# Patient Record
Sex: Female | Born: 1950 | Race: Black or African American | Hispanic: No | Smoking: Never smoker
Health system: Southern US, Community
[De-identification: ages and names within clinical notes are randomized; demographics above are authoritative.]

## PROBLEM LIST (undated history)

## (undated) DIAGNOSIS — C569 Malignant neoplasm of unspecified ovary: Secondary | ICD-10-CM

## (undated) DIAGNOSIS — I10 Essential (primary) hypertension: Secondary | ICD-10-CM

## (undated) DIAGNOSIS — C50919 Malignant neoplasm of unspecified site of unspecified female breast: Secondary | ICD-10-CM

## (undated) DIAGNOSIS — E119 Type 2 diabetes mellitus without complications: Secondary | ICD-10-CM

## (undated) DIAGNOSIS — J301 Allergic rhinitis due to pollen: Secondary | ICD-10-CM

## (undated) HISTORY — DX: Malignant neoplasm of unspecified ovary: C56.9

## (undated) HISTORY — PX: TONSILLECTOMY: SUR1361

## (undated) HISTORY — DX: Type 2 diabetes mellitus without complications: E11.9

## (undated) HISTORY — PX: OTHER SURGICAL HISTORY: SHX169

## (undated) HISTORY — PX: BILATERAL TOTAL MASTECTOMY WITH AXILLARY LYMPH NODE DISSECTION: SHX6364

## (undated) HISTORY — PX: ABDOMINAL HYSTERECTOMY: SHX81

## (undated) HISTORY — DX: Allergic rhinitis due to pollen: J30.1

## (undated) HISTORY — DX: Malignant neoplasm of unspecified site of unspecified female breast: C50.919

## (undated) HISTORY — DX: Essential (primary) hypertension: I10

---

## 1998-03-31 ENCOUNTER — Emergency Department (HOSPITAL_COMMUNITY): Admission: EM | Admit: 1998-03-31 | Discharge: 1998-03-31 | Payer: Self-pay | Admitting: Internal Medicine

## 1998-05-06 ENCOUNTER — Emergency Department (HOSPITAL_COMMUNITY): Admission: EM | Admit: 1998-05-06 | Discharge: 1998-05-06 | Payer: Self-pay | Admitting: Emergency Medicine

## 1998-05-20 ENCOUNTER — Ambulatory Visit (HOSPITAL_COMMUNITY): Admission: RE | Admit: 1998-05-20 | Discharge: 1998-05-20 | Payer: Self-pay | Admitting: Cardiology

## 1998-11-05 ENCOUNTER — Emergency Department (HOSPITAL_COMMUNITY): Admission: EM | Admit: 1998-11-05 | Discharge: 1998-11-05 | Payer: Self-pay | Admitting: Emergency Medicine

## 1999-10-30 ENCOUNTER — Emergency Department (HOSPITAL_COMMUNITY): Admission: EM | Admit: 1999-10-30 | Discharge: 1999-10-30 | Payer: Self-pay | Admitting: Emergency Medicine

## 2001-11-13 ENCOUNTER — Encounter: Admission: RE | Admit: 2001-11-13 | Discharge: 2001-11-13 | Payer: Self-pay | Admitting: Unknown Physician Specialty

## 2001-11-13 ENCOUNTER — Encounter: Payer: Self-pay | Admitting: Unknown Physician Specialty

## 2004-01-27 ENCOUNTER — Ambulatory Visit (HOSPITAL_COMMUNITY): Admission: RE | Admit: 2004-01-27 | Discharge: 2004-01-27 | Payer: Self-pay | Admitting: Unknown Physician Specialty

## 2005-10-16 ENCOUNTER — Ambulatory Visit (HOSPITAL_COMMUNITY): Admission: RE | Admit: 2005-10-16 | Discharge: 2005-10-16 | Payer: Self-pay | Admitting: Unknown Physician Specialty

## 2005-11-07 ENCOUNTER — Encounter: Admission: RE | Admit: 2005-11-07 | Discharge: 2005-11-07 | Payer: Self-pay | Admitting: Unknown Physician Specialty

## 2005-11-12 ENCOUNTER — Encounter (INDEPENDENT_AMBULATORY_CARE_PROVIDER_SITE_OTHER): Payer: Self-pay | Admitting: *Deleted

## 2005-11-12 ENCOUNTER — Encounter (INDEPENDENT_AMBULATORY_CARE_PROVIDER_SITE_OTHER): Payer: Self-pay | Admitting: Diagnostic Radiology

## 2005-11-12 ENCOUNTER — Other Ambulatory Visit: Admission: RE | Admit: 2005-11-12 | Discharge: 2005-11-12 | Payer: Self-pay | Admitting: Diagnostic Radiology

## 2005-11-12 ENCOUNTER — Encounter: Admission: RE | Admit: 2005-11-12 | Discharge: 2005-11-12 | Payer: Self-pay | Admitting: Unknown Physician Specialty

## 2005-11-20 ENCOUNTER — Encounter: Admission: RE | Admit: 2005-11-20 | Discharge: 2005-11-20 | Payer: Self-pay | Admitting: Unknown Physician Specialty

## 2005-12-04 ENCOUNTER — Ambulatory Visit (HOSPITAL_BASED_OUTPATIENT_CLINIC_OR_DEPARTMENT_OTHER): Admission: RE | Admit: 2005-12-04 | Discharge: 2005-12-04 | Payer: Self-pay | Admitting: Surgery

## 2005-12-04 ENCOUNTER — Encounter (INDEPENDENT_AMBULATORY_CARE_PROVIDER_SITE_OTHER): Payer: Self-pay | Admitting: *Deleted

## 2005-12-05 ENCOUNTER — Encounter: Payer: Self-pay | Admitting: Emergency Medicine

## 2005-12-13 ENCOUNTER — Ambulatory Visit: Payer: Self-pay | Admitting: Oncology

## 2005-12-19 LAB — CANCER ANTIGEN 27.29: CA 27.29: 15 U/mL (ref 0–39)

## 2005-12-19 LAB — COMPREHENSIVE METABOLIC PANEL
ALT: 9 U/L (ref 0–40)
CO2: 27 mEq/L (ref 19–32)
Calcium: 8.9 mg/dL (ref 8.4–10.5)
Chloride: 106 mEq/L (ref 96–112)
Sodium: 141 mEq/L (ref 135–145)
Total Protein: 6.9 g/dL (ref 6.0–8.3)

## 2005-12-19 LAB — CBC WITH DIFFERENTIAL/PLATELET
Eosinophils Absolute: 0.4 10*3/uL (ref 0.0–0.5)
MONO#: 0.8 10*3/uL (ref 0.1–0.9)
NEUT#: 5.7 10*3/uL (ref 1.5–6.5)
Platelets: 371 10*3/uL (ref 145–400)
RBC: 4.83 10*6/uL (ref 3.70–5.32)
RDW: 15.5 % — ABNORMAL HIGH (ref 11.3–14.5)
WBC: 9.6 10*3/uL (ref 3.9–10.0)
lymph#: 2.6 10*3/uL (ref 0.9–3.3)

## 2005-12-19 LAB — LACTATE DEHYDROGENASE: LDH: 138 U/L (ref 94–250)

## 2005-12-21 ENCOUNTER — Ambulatory Visit (HOSPITAL_COMMUNITY): Admission: RE | Admit: 2005-12-21 | Discharge: 2005-12-21 | Payer: Self-pay | Admitting: Oncology

## 2005-12-26 ENCOUNTER — Ambulatory Visit (HOSPITAL_COMMUNITY): Admission: RE | Admit: 2005-12-26 | Discharge: 2005-12-26 | Payer: Self-pay | Admitting: Oncology

## 2005-12-28 ENCOUNTER — Ambulatory Visit (HOSPITAL_COMMUNITY): Admission: RE | Admit: 2005-12-28 | Discharge: 2005-12-28 | Payer: Self-pay | Admitting: Oncology

## 2005-12-28 LAB — CBC WITH DIFFERENTIAL/PLATELET
Basophils Absolute: 0 10*3/uL (ref 0.0–0.1)
EOS%: 3.6 % (ref 0.0–7.0)
Eosinophils Absolute: 0.4 10*3/uL (ref 0.0–0.5)
HCT: 36.9 % (ref 34.8–46.6)
HGB: 12 g/dL (ref 11.6–15.9)
MONO#: 0.8 10*3/uL (ref 0.1–0.9)
NEUT#: 6 10*3/uL (ref 1.5–6.5)
NEUT%: 60.1 % (ref 39.6–76.8)
RDW: 16.2 % — ABNORMAL HIGH (ref 11.3–14.5)
WBC: 9.9 10*3/uL (ref 3.9–10.0)
lymph#: 2.8 10*3/uL (ref 0.9–3.3)

## 2005-12-28 LAB — COMPREHENSIVE METABOLIC PANEL
Albumin: 4 g/dL (ref 3.5–5.2)
Alkaline Phosphatase: 74 U/L (ref 39–117)
Calcium: 9.4 mg/dL (ref 8.4–10.5)
Chloride: 103 mEq/L (ref 96–112)
Glucose, Bld: 103 mg/dL — ABNORMAL HIGH (ref 70–99)
Potassium: 3.8 mEq/L (ref 3.5–5.3)
Sodium: 141 mEq/L (ref 135–145)
Total Protein: 7.3 g/dL (ref 6.0–8.3)

## 2006-01-07 ENCOUNTER — Ambulatory Visit (HOSPITAL_BASED_OUTPATIENT_CLINIC_OR_DEPARTMENT_OTHER): Admission: RE | Admit: 2006-01-07 | Discharge: 2006-01-07 | Payer: Self-pay | Admitting: Surgery

## 2006-01-18 LAB — CBC WITH DIFFERENTIAL/PLATELET
Basophils Absolute: 0 10*3/uL (ref 0.0–0.1)
EOS%: 9.6 % — ABNORMAL HIGH (ref 0.0–7.0)
HCT: 37.1 % (ref 34.8–46.6)
HGB: 12.2 g/dL (ref 11.6–15.9)
MCH: 24.5 pg — ABNORMAL LOW (ref 26.0–34.0)
MCV: 74.2 fL — ABNORMAL LOW (ref 81.0–101.0)
MONO%: 9.7 % (ref 0.0–13.0)
NEUT%: 25.8 % — ABNORMAL LOW (ref 39.6–76.8)
RDW: 15.8 % — ABNORMAL HIGH (ref 11.3–14.5)

## 2006-01-29 ENCOUNTER — Ambulatory Visit: Payer: Self-pay | Admitting: Oncology

## 2006-02-01 LAB — CBC WITH DIFFERENTIAL/PLATELET
EOS%: 0 % (ref 0.0–7.0)
MCH: 25.2 pg — ABNORMAL LOW (ref 26.0–34.0)
MCV: 76.2 fL — ABNORMAL LOW (ref 81.0–101.0)
MONO%: 6.9 % (ref 0.0–13.0)
RBC: 4.7 10*6/uL (ref 3.70–5.32)
RDW: 15.1 % — ABNORMAL HIGH (ref 11.3–14.5)

## 2006-02-01 LAB — COMPREHENSIVE METABOLIC PANEL
Albumin: 4.3 g/dL (ref 3.5–5.2)
CO2: 23 mEq/L (ref 19–32)
Glucose, Bld: 164 mg/dL — ABNORMAL HIGH (ref 70–99)
Sodium: 139 mEq/L (ref 135–145)
Total Bilirubin: 0.3 mg/dL (ref 0.3–1.2)
Total Protein: 6.8 g/dL (ref 6.0–8.3)

## 2006-02-08 LAB — CBC WITH DIFFERENTIAL/PLATELET
Basophils Absolute: 0.1 10*3/uL (ref 0.0–0.1)
HCT: 33.5 % — ABNORMAL LOW (ref 34.8–46.6)
HGB: 11 g/dL — ABNORMAL LOW (ref 11.6–15.9)
LYMPH%: 40.1 % (ref 14.0–48.0)
MONO#: 0.3 10*3/uL (ref 0.1–0.9)
NEUT%: 47.9 % (ref 39.6–76.8)
Platelets: 143 10*3/uL — ABNORMAL LOW (ref 145–400)
WBC: 3.8 10*3/uL — ABNORMAL LOW (ref 3.9–10.0)
lymph#: 1.5 10*3/uL (ref 0.9–3.3)

## 2006-02-22 LAB — CBC WITH DIFFERENTIAL/PLATELET
Basophils Absolute: 0 10*3/uL (ref 0.0–0.1)
EOS%: 0.1 % (ref 0.0–7.0)
HCT: 35.9 % (ref 34.8–46.6)
HGB: 11.7 g/dL (ref 11.6–15.9)
LYMPH%: 5.7 % — ABNORMAL LOW (ref 14.0–48.0)
MCH: 25.5 pg — ABNORMAL LOW (ref 26.0–34.0)
MCHC: 32.5 g/dL (ref 32.0–36.0)
MCV: 78.5 fL — ABNORMAL LOW (ref 81.0–101.0)
MONO%: 1.4 % (ref 0.0–13.0)
NEUT%: 92.7 % — ABNORMAL HIGH (ref 39.6–76.8)
Platelets: 461 10*3/uL — ABNORMAL HIGH (ref 145–400)

## 2006-02-22 LAB — COMPREHENSIVE METABOLIC PANEL
AST: 12 U/L (ref 0–37)
Alkaline Phosphatase: 60 U/L (ref 39–117)
BUN: 12 mg/dL (ref 6–23)
Calcium: 9.9 mg/dL (ref 8.4–10.5)
Chloride: 103 mEq/L (ref 96–112)
Creatinine, Ser: 0.6 mg/dL (ref 0.40–1.20)
Total Bilirubin: 0.5 mg/dL (ref 0.3–1.2)

## 2006-03-01 LAB — CBC WITH DIFFERENTIAL/PLATELET
Basophils Absolute: 0.1 10*3/uL (ref 0.0–0.1)
EOS%: 2.7 % (ref 0.0–7.0)
HCT: 31.1 % — ABNORMAL LOW (ref 34.8–46.6)
HGB: 10.5 g/dL — ABNORMAL LOW (ref 11.6–15.9)
MCH: 26 pg (ref 26.0–34.0)
NEUT%: 30.3 % — ABNORMAL LOW (ref 39.6–76.8)
Platelets: 118 10*3/uL — ABNORMAL LOW (ref 145–400)
lymph#: 1.4 10*3/uL (ref 0.9–3.3)

## 2006-03-15 ENCOUNTER — Ambulatory Visit: Payer: Self-pay | Admitting: Oncology

## 2006-03-15 LAB — CBC WITH DIFFERENTIAL/PLATELET
EOS%: 0 % (ref 0.0–7.0)
MCH: 26.3 pg (ref 26.0–34.0)
MCV: 80.2 fL — ABNORMAL LOW (ref 81.0–101.0)
MONO%: 1.4 % (ref 0.0–13.0)
NEUT#: 15.3 10*3/uL — ABNORMAL HIGH (ref 1.5–6.5)
RBC: 4.29 10*6/uL (ref 3.70–5.32)
RDW: 18 % — ABNORMAL HIGH (ref 11.3–14.5)

## 2006-03-22 LAB — CBC WITH DIFFERENTIAL/PLATELET
BASO%: 1.5 % (ref 0.0–2.0)
EOS%: 1.2 % (ref 0.0–7.0)
MCH: 26.6 pg (ref 26.0–34.0)
MCHC: 33.5 g/dL (ref 32.0–36.0)
MONO#: 0.3 10*3/uL (ref 0.1–0.9)
RBC: 3.92 10*6/uL (ref 3.70–5.32)
RDW: 20.7 % — ABNORMAL HIGH (ref 11.3–14.5)
WBC: 2.5 10*3/uL — ABNORMAL LOW (ref 3.9–10.0)
lymph#: 1.2 10*3/uL (ref 0.9–3.3)

## 2006-04-04 LAB — CBC WITH DIFFERENTIAL/PLATELET
BASO%: 0.3 % (ref 0.0–2.0)
Basophils Absolute: 0 10*3/uL (ref 0.0–0.1)
EOS%: 0.3 % (ref 0.0–7.0)
HCT: 33 % — ABNORMAL LOW (ref 34.8–46.6)
HGB: 10.9 g/dL — ABNORMAL LOW (ref 11.6–15.9)
MCHC: 33 g/dL (ref 32.0–36.0)
MONO#: 1.5 10*3/uL — ABNORMAL HIGH (ref 0.1–0.9)
NEUT%: 71.4 % (ref 39.6–76.8)
RDW: 21.5 % — ABNORMAL HIGH (ref 11.3–14.5)
WBC: 10.7 10*3/uL — ABNORMAL HIGH (ref 3.9–10.0)
lymph#: 1.5 10*3/uL (ref 0.9–3.3)

## 2006-04-11 LAB — CBC WITH DIFFERENTIAL/PLATELET
Basophils Absolute: 0 10*3/uL (ref 0.0–0.1)
EOS%: 1.7 % (ref 0.0–7.0)
Eosinophils Absolute: 0 10*3/uL (ref 0.0–0.5)
HCT: 31.7 % — ABNORMAL LOW (ref 34.8–46.6)
HGB: 10.6 g/dL — ABNORMAL LOW (ref 11.6–15.9)
MCH: 27.6 pg (ref 26.0–34.0)
NEUT#: 0.8 10*3/uL — ABNORMAL LOW (ref 1.5–6.5)
NEUT%: 47.5 % (ref 39.6–76.8)
RDW: 20.2 % — ABNORMAL HIGH (ref 11.3–14.5)
lymph#: 0.8 10*3/uL — ABNORMAL LOW (ref 0.9–3.3)

## 2006-04-26 LAB — CBC WITH DIFFERENTIAL/PLATELET
BASO%: 2.4 % — ABNORMAL HIGH (ref 0.0–2.0)
HCT: 33.6 % — ABNORMAL LOW (ref 34.8–46.6)
HGB: 11 g/dL — ABNORMAL LOW (ref 11.6–15.9)
MCHC: 32.8 g/dL (ref 32.0–36.0)
MONO#: 1.3 10*3/uL — ABNORMAL HIGH (ref 0.1–0.9)
NEUT#: 6 10*3/uL (ref 1.5–6.5)
RBC: 3.98 10*6/uL (ref 3.70–5.32)
RDW: 16.9 % — ABNORMAL HIGH (ref 11.3–14.5)
lymph#: 1.4 10*3/uL (ref 0.9–3.3)

## 2006-05-01 ENCOUNTER — Ambulatory Visit: Payer: Self-pay | Admitting: Oncology

## 2006-05-03 LAB — CBC WITH DIFFERENTIAL/PLATELET
BASO%: 2.2 % — ABNORMAL HIGH (ref 0.0–2.0)
Basophils Absolute: 0 10*3/uL (ref 0.0–0.1)
HCT: 29.9 % — ABNORMAL LOW (ref 34.8–46.6)
HGB: 10.1 g/dL — ABNORMAL LOW (ref 11.6–15.9)
MONO#: 0.1 10*3/uL (ref 0.1–0.9)
NEUT%: 29.4 % — ABNORMAL LOW (ref 39.6–76.8)
WBC: 1.5 10*3/uL — ABNORMAL LOW (ref 3.9–10.0)
lymph#: 0.9 10*3/uL (ref 0.9–3.3)

## 2006-05-07 ENCOUNTER — Ambulatory Visit: Admission: RE | Admit: 2006-05-07 | Discharge: 2006-07-19 | Payer: Self-pay | Admitting: Radiation Oncology

## 2006-05-17 LAB — CBC WITH DIFFERENTIAL/PLATELET
Basophils Absolute: 0.1 10*3/uL (ref 0.0–0.1)
EOS%: 0.1 % (ref 0.0–7.0)
HGB: 10.6 g/dL — ABNORMAL LOW (ref 11.6–15.9)
MCH: 28.9 pg (ref 26.0–34.0)
MCHC: 33.4 g/dL (ref 32.0–36.0)
MCV: 86.4 fL (ref 81.0–101.0)
MONO%: 16 % — ABNORMAL HIGH (ref 0.0–13.0)
RDW: 16.8 % — ABNORMAL HIGH (ref 11.3–14.5)

## 2006-07-22 ENCOUNTER — Ambulatory Visit: Payer: Self-pay | Admitting: Oncology

## 2006-07-24 LAB — CBC WITH DIFFERENTIAL/PLATELET
Basophils Absolute: 0 10*3/uL (ref 0.0–0.1)
Eosinophils Absolute: 0.2 10*3/uL (ref 0.0–0.5)
HCT: 34 % — ABNORMAL LOW (ref 34.8–46.6)
HGB: 11.1 g/dL — ABNORMAL LOW (ref 11.6–15.9)
LYMPH%: 15.9 % (ref 14.0–48.0)
MONO#: 0.7 10*3/uL (ref 0.1–0.9)
NEUT#: 6.8 10*3/uL — ABNORMAL HIGH (ref 1.5–6.5)
NEUT%: 73.3 % (ref 39.6–76.8)
Platelets: 308 10*3/uL (ref 145–400)
WBC: 9.2 10*3/uL (ref 3.9–10.0)

## 2006-07-24 LAB — COMPREHENSIVE METABOLIC PANEL
ALT: 11 U/L (ref 0–35)
AST: 9 U/L (ref 0–37)
Albumin: 3.6 g/dL (ref 3.5–5.2)
Alkaline Phosphatase: 67 U/L (ref 39–117)
BUN: 9 mg/dL (ref 6–23)
Potassium: 3.3 mEq/L — ABNORMAL LOW (ref 3.5–5.3)
Total Protein: 6.5 g/dL (ref 6.0–8.3)

## 2006-08-14 ENCOUNTER — Ambulatory Visit (HOSPITAL_BASED_OUTPATIENT_CLINIC_OR_DEPARTMENT_OTHER): Admission: RE | Admit: 2006-08-14 | Discharge: 2006-08-14 | Payer: Self-pay | Admitting: Surgery

## 2006-10-14 ENCOUNTER — Ambulatory Visit: Payer: Self-pay | Admitting: Oncology

## 2006-10-16 LAB — CBC WITH DIFFERENTIAL/PLATELET
BASO%: 0.4 % (ref 0.0–2.0)
LYMPH%: 19.7 % (ref 14.0–48.0)
MCHC: 33.6 g/dL (ref 32.0–36.0)
MCV: 76.2 fL — ABNORMAL LOW (ref 81.0–101.0)
MONO#: 0.9 10*3/uL (ref 0.1–0.9)
MONO%: 9.9 % (ref 0.0–13.0)
Platelets: 295 10*3/uL (ref 145–400)
RBC: 4.78 10*6/uL (ref 3.70–5.32)
RDW: 16.2 % — ABNORMAL HIGH (ref 11.3–14.5)
WBC: 9.3 10*3/uL (ref 3.9–10.0)

## 2006-10-16 LAB — COMPREHENSIVE METABOLIC PANEL
ALT: 13 U/L (ref 0–35)
Alkaline Phosphatase: 94 U/L (ref 39–117)
Sodium: 140 mEq/L (ref 135–145)
Total Bilirubin: 0.3 mg/dL (ref 0.3–1.2)
Total Protein: 7.1 g/dL (ref 6.0–8.3)

## 2006-10-16 LAB — CANCER ANTIGEN 27.29: CA 27.29: 16 U/mL (ref 0–39)

## 2006-10-23 ENCOUNTER — Encounter: Admission: RE | Admit: 2006-10-23 | Discharge: 2006-10-23 | Payer: Self-pay | Admitting: Oncology

## 2006-10-29 ENCOUNTER — Encounter: Admission: RE | Admit: 2006-10-29 | Discharge: 2006-10-29 | Payer: Self-pay | Admitting: Oncology

## 2007-01-14 ENCOUNTER — Ambulatory Visit: Payer: Self-pay | Admitting: Oncology

## 2007-01-16 LAB — LACTATE DEHYDROGENASE: LDH: 143 U/L (ref 94–250)

## 2007-01-16 LAB — CBC WITH DIFFERENTIAL/PLATELET
BASO%: 0.4 % (ref 0.0–2.0)
Eosinophils Absolute: 0.2 10*3/uL (ref 0.0–0.5)
LYMPH%: 18 % (ref 14.0–48.0)
MCHC: 33.3 g/dL (ref 32.0–36.0)
MONO#: 0.7 10*3/uL (ref 0.1–0.9)
NEUT#: 6.4 10*3/uL (ref 1.5–6.5)
Platelets: 312 10*3/uL (ref 145–400)
RBC: 4.92 10*6/uL (ref 3.70–5.32)
RDW: 14.8 % — ABNORMAL HIGH (ref 11.3–14.5)
WBC: 8.8 10*3/uL (ref 3.9–10.0)
lymph#: 1.6 10*3/uL (ref 0.9–3.3)

## 2007-01-16 LAB — COMPREHENSIVE METABOLIC PANEL
ALT: 11 U/L (ref 0–35)
AST: 9 U/L (ref 0–37)
Albumin: 4.1 g/dL (ref 3.5–5.2)
CO2: 24 mEq/L (ref 19–32)
Chloride: 106 mEq/L (ref 96–112)
Glucose, Bld: 126 mg/dL — ABNORMAL HIGH (ref 70–99)
Potassium: 3.9 mEq/L (ref 3.5–5.3)
Sodium: 142 mEq/L (ref 135–145)
Total Bilirubin: 0.3 mg/dL (ref 0.3–1.2)
Total Protein: 7.2 g/dL (ref 6.0–8.3)

## 2007-02-14 ENCOUNTER — Ambulatory Visit (HOSPITAL_COMMUNITY): Admission: RE | Admit: 2007-02-14 | Discharge: 2007-02-14 | Payer: Self-pay | Admitting: Obstetrics and Gynecology

## 2007-02-14 ENCOUNTER — Encounter (INDEPENDENT_AMBULATORY_CARE_PROVIDER_SITE_OTHER): Payer: Self-pay | Admitting: Obstetrics and Gynecology

## 2007-05-19 ENCOUNTER — Ambulatory Visit: Payer: Self-pay | Admitting: Oncology

## 2007-05-21 LAB — CBC WITH DIFFERENTIAL/PLATELET
Basophils Absolute: 0 10*3/uL (ref 0.0–0.1)
EOS%: 1.7 % (ref 0.0–7.0)
Eosinophils Absolute: 0.2 10*3/uL (ref 0.0–0.5)
HCT: 37 % (ref 34.8–46.6)
HGB: 12.5 g/dL (ref 11.6–15.9)
LYMPH%: 20.1 % (ref 14.0–48.0)
MCH: 26.1 pg (ref 26.0–34.0)
MCV: 77.4 fL — ABNORMAL LOW (ref 81.0–101.0)
MONO%: 8.3 % (ref 0.0–13.0)
NEUT#: 7.3 10*3/uL — ABNORMAL HIGH (ref 1.5–6.5)
NEUT%: 69.4 % (ref 39.6–76.8)
Platelets: 314 10*3/uL (ref 145–400)
RDW: 14.6 % — ABNORMAL HIGH (ref 11.3–14.5)

## 2007-05-21 LAB — COMPREHENSIVE METABOLIC PANEL
AST: 10 U/L (ref 0–37)
Albumin: 3.9 g/dL (ref 3.5–5.2)
Alkaline Phosphatase: 82 U/L (ref 39–117)
BUN: 10 mg/dL (ref 6–23)
Calcium: 9.6 mg/dL (ref 8.4–10.5)
Creatinine, Ser: 0.77 mg/dL (ref 0.40–1.20)
Glucose, Bld: 185 mg/dL — ABNORMAL HIGH (ref 70–99)
Potassium: 3.9 mEq/L (ref 3.5–5.3)

## 2007-05-21 LAB — LACTATE DEHYDROGENASE: LDH: 142 U/L (ref 94–250)

## 2007-05-22 ENCOUNTER — Ambulatory Visit (HOSPITAL_COMMUNITY): Admission: RE | Admit: 2007-05-22 | Discharge: 2007-05-22 | Payer: Self-pay | Admitting: Oncology

## 2007-08-10 IMAGING — CT CT ABDOMEN W/ CM
1 of 4 series · 13 of 32 positions shown, 18 images · IV contrast (omnipaque)
Comparison: none

CLINICAL DATA: New diagnosis of breast cancer.  History of cervical cancer in 4224.  Status post right lumpectomy.
 CHEST CT WITH CONTRAST:
TECHNIQUE: Multidetector CT imaging of the chest was performed following the standard protocol during bolus administration of intravenous contrast.
 Contrast:  125 cc Omnipaque 300.
TECHNIQUE: Multidetector CT imaging of the abdomen was performed following the standard protocol during bolus administration of intravenous contrast.
TECHNIQUE: Multidetector CT imaging of the pelvis was performed following the standard protocol during bolus administration of intravenous contrast.

[Series 2: cap 5.0 b40f st · axial · 0.84mm/px · z∈[-578,-58]mm · 13 of 121 slices shown, 18 images]
[im 9/121  soft-tissue]
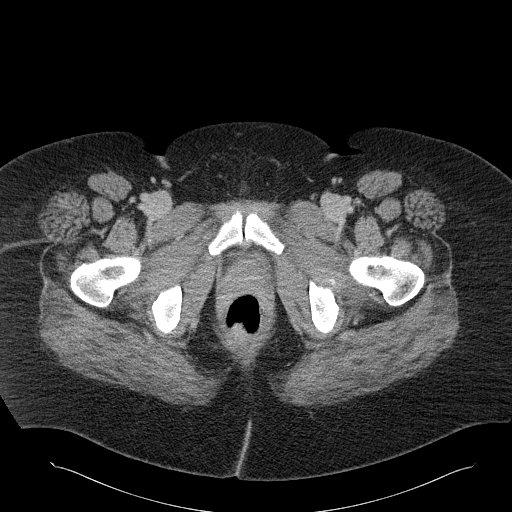
[im 9/121  bone]
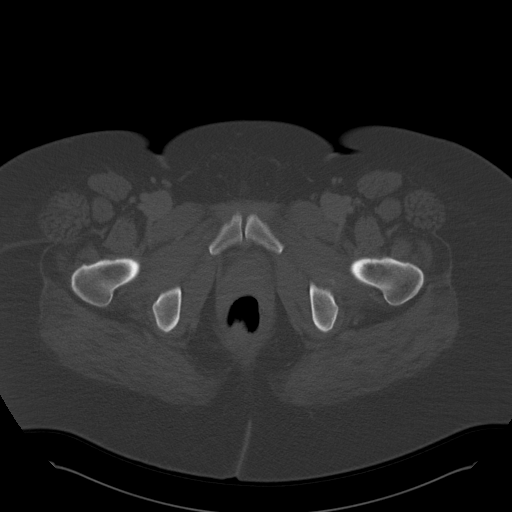
[im 17/121  soft-tissue]
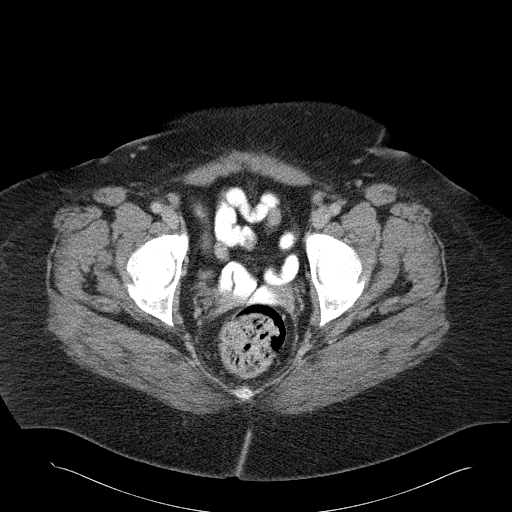
[im 25/121  soft-tissue]
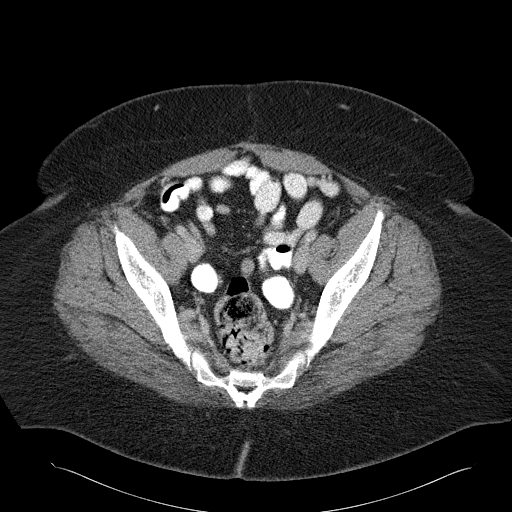
[im 41/121  soft-tissue]
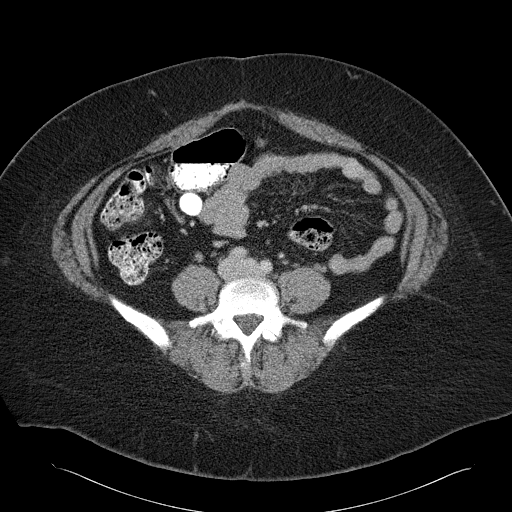
[im 49/121  soft-tissue]
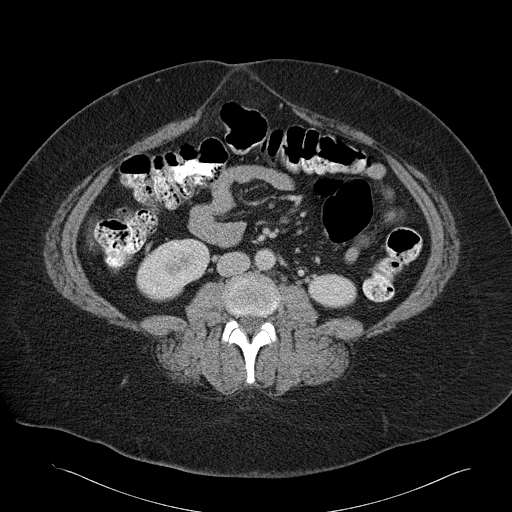
[im 57/121  soft-tissue]
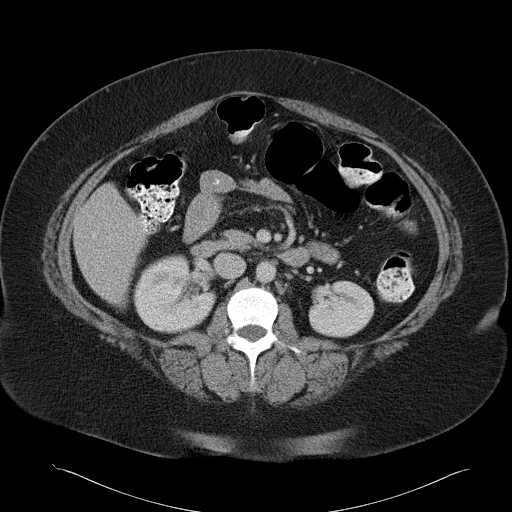
[im 65/121  soft-tissue]
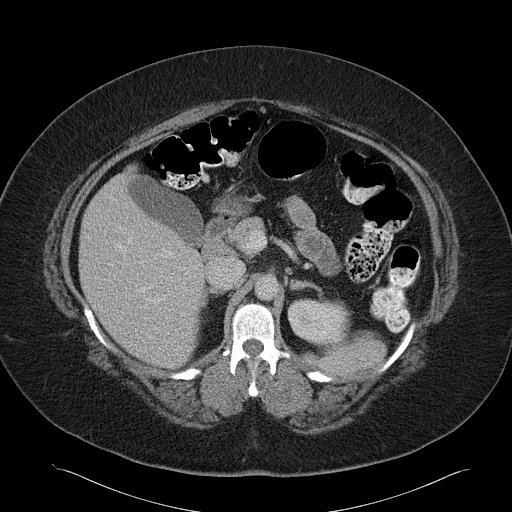
[im 73/121  soft-tissue]
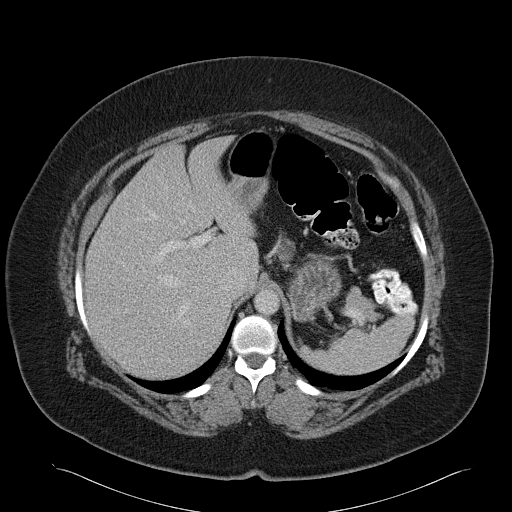
[im 81/121  soft-tissue]
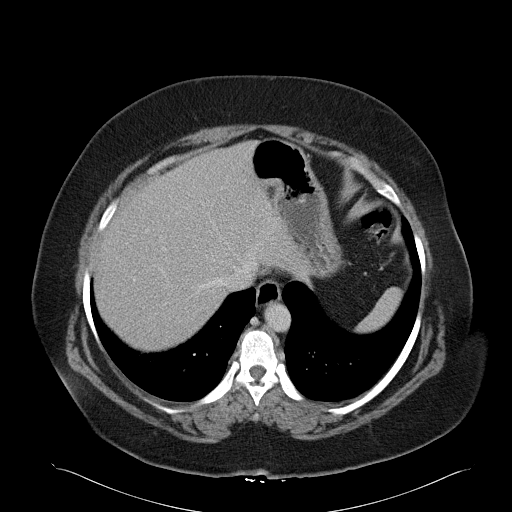
[im 81/121  bone]
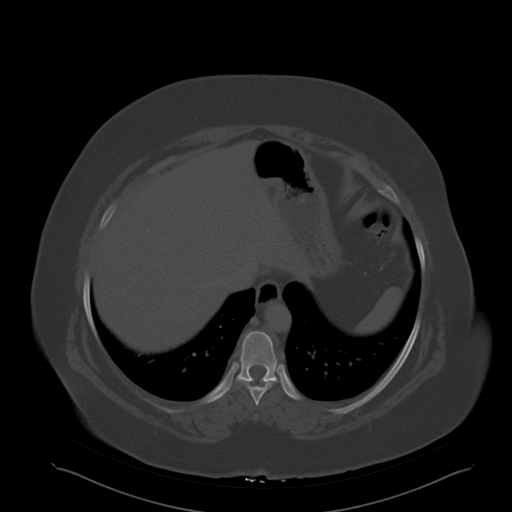
[im 89/121  lung]
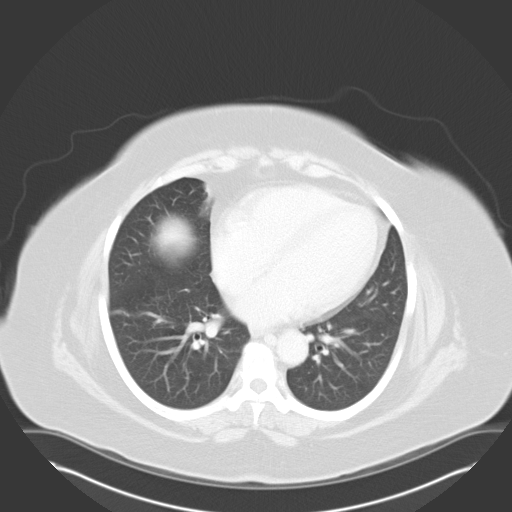
[im 97/121  soft-tissue]
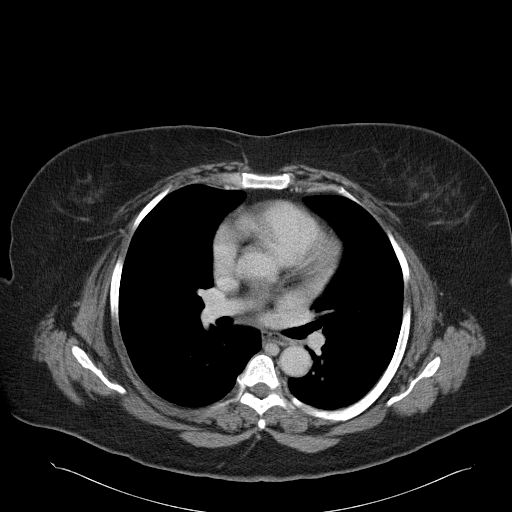
[im 97/121  lung]
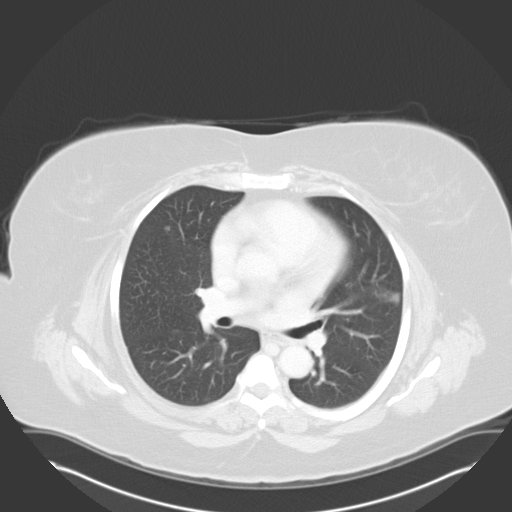
[im 105/121  soft-tissue]
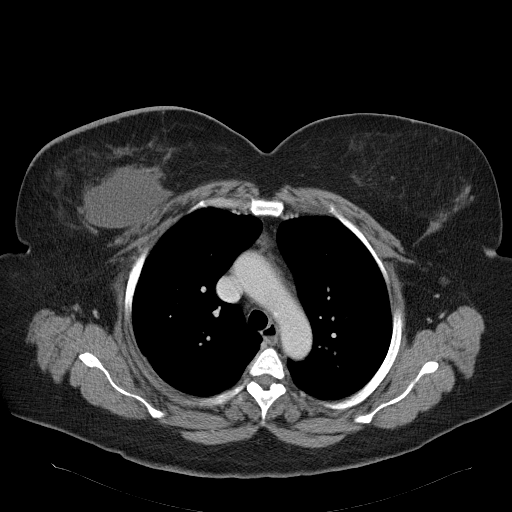
[im 105/121  lung]
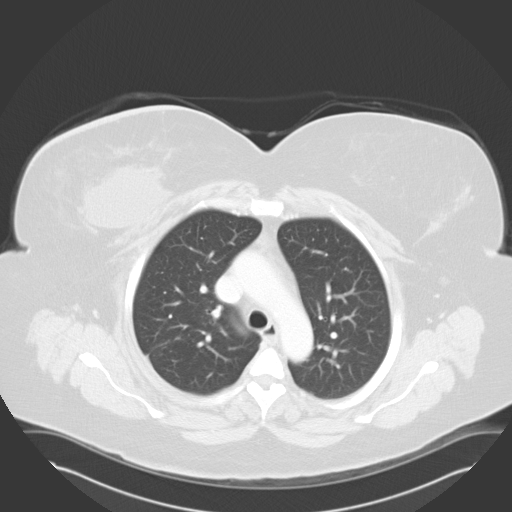
[im 113/121  soft-tissue]
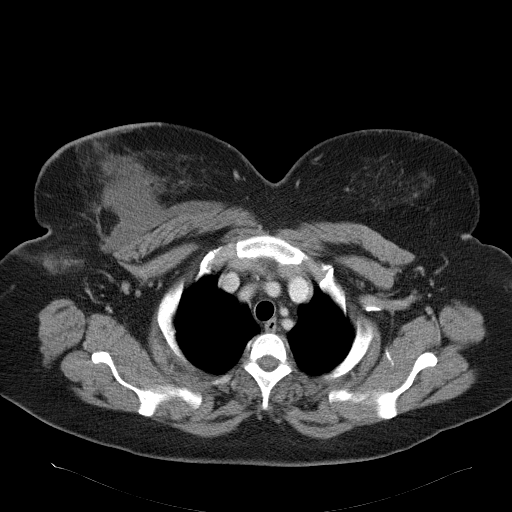
[im 113/121  lung]
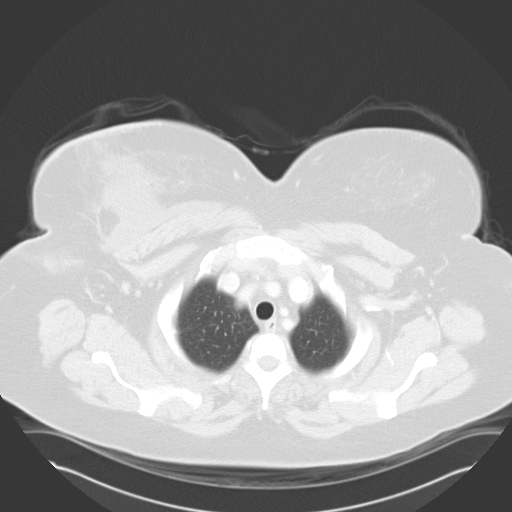

[13 of 32 positions shown; findings below may reference images not displayed]

FINDINGS: The thyroid gland enhances homogeneously.  There is a large fluid attenuating mass within the right breast, which presumably represents a postoperative seroma and cavity from patient?s lumpectomy.  This measures 6.6 x 6.5 cm (image 13).  A right axillary lymph node dissection has been performed.  No pathologically enlarged right axillary lymph nodes are identified.  No pathologically enlarged mediastinal lymph nodes are noted.  There is no hilar lymphadenopathy.  
 There is no pleural or pericardial effusion.
 Within the superior segment of the right upper lobe, there is a small nodule (image82).  This measures 4.2 mm.  Additional nodule within the right middle lobe (image 25) measures 4.3 mm.  There is a nodule within the right lower lobe, which measures 6.4 mm (image 28).  No additional suspicious pulmonary nodules or masses are identified.  
 Review of bone windows shows no lytic or sclerotic lesions.
IMPRESSION: 1.  Postoperative changes in the right axilla and right chest from lumpectomy and axillary nodal dissection.
 2.  Several subcentimeter pulmonary nodules within the right lung measure up to 6.4 mm.  This is a nonspecific finding.  However, given the patient?s history of breast cancer, a short-term interval follow-up in 3 months would be recommended.
 ABDOMEN CT WITH CONTRAST:
FINDINGS: The liver is normal in attenuation and morphology.  The spleen is negative.  The pancreas is negative.  Gallbladder negative.  The adrenal glands are negative.
 The kidneys are both normal.  I see no enlarged retroperitoneal lymph nodes.  There are scattered subcentimeter paraaortic lymph nodes.  
 The visualized bowel loops are normal in caliber.  No abnormal abdominal fluid collections are identified.
 Review of the bone windows shows no lytic or sclerotic lesions.
IMPRESSION: No evidence for abdominal metastasis.
 PELVIS CT WITH CONTRAST:
FINDINGS: There are no pathologically enlarged pelvic lymph nodes.  No inguinal lymphadenopathy.  There is no free fluid.  No abnormal fluid collections.
 Review of the bone windows shows no lytic or sclerotic lesions.
IMPRESSION: No evidence for breast cancer metastasis to the pelvis.

## 2007-09-24 ENCOUNTER — Ambulatory Visit: Payer: Self-pay | Admitting: Oncology

## 2007-09-26 LAB — CBC WITH DIFFERENTIAL/PLATELET
BASO%: 0.3 % (ref 0.0–2.0)
EOS%: 2.3 % (ref 0.0–7.0)
Eosinophils Absolute: 0.3 10*3/uL (ref 0.0–0.5)
LYMPH%: 16.6 % (ref 14.0–48.0)
MCH: 25.8 pg — ABNORMAL LOW (ref 26.0–34.0)
MCHC: 34 g/dL (ref 32.0–36.0)
MCV: 76 fL — ABNORMAL LOW (ref 81.0–101.0)
MONO%: 7.5 % (ref 0.0–13.0)
Platelets: 317 10*3/uL (ref 145–400)
RBC: 4.67 10*6/uL (ref 3.70–5.32)

## 2007-09-27 LAB — COMPREHENSIVE METABOLIC PANEL
AST: 11 U/L (ref 0–37)
Alkaline Phosphatase: 78 U/L (ref 39–117)
Glucose, Bld: 143 mg/dL — ABNORMAL HIGH (ref 70–99)
Sodium: 138 mEq/L (ref 135–145)
Total Bilirubin: 0.4 mg/dL (ref 0.3–1.2)
Total Protein: 7.2 g/dL (ref 6.0–8.3)

## 2007-10-29 ENCOUNTER — Encounter: Admission: RE | Admit: 2007-10-29 | Discharge: 2007-10-29 | Payer: Self-pay | Admitting: Oncology

## 2007-11-07 ENCOUNTER — Encounter: Admission: RE | Admit: 2007-11-07 | Discharge: 2007-11-07 | Payer: Self-pay | Admitting: Oncology

## 2007-12-10 ENCOUNTER — Ambulatory Visit: Payer: Self-pay | Admitting: Oncology

## 2007-12-10 LAB — COMPREHENSIVE METABOLIC PANEL
ALT: 14 U/L (ref 0–35)
AST: 13 U/L (ref 0–37)
Albumin: 4.2 g/dL (ref 3.5–5.2)
Alkaline Phosphatase: 74 U/L (ref 39–117)
Calcium: 9.7 mg/dL (ref 8.4–10.5)
Chloride: 103 mEq/L (ref 96–112)
Potassium: 3.6 mEq/L (ref 3.5–5.3)
Sodium: 140 mEq/L (ref 135–145)
Total Protein: 7.5 g/dL (ref 6.0–8.3)

## 2007-12-19 ENCOUNTER — Encounter: Admission: RE | Admit: 2007-12-19 | Discharge: 2007-12-19 | Payer: Self-pay | Admitting: Oncology

## 2008-04-09 ENCOUNTER — Ambulatory Visit: Payer: Self-pay | Admitting: Oncology

## 2008-04-15 LAB — CBC WITH DIFFERENTIAL/PLATELET
Basophils Absolute: 0.1 10*3/uL (ref 0.0–0.1)
Eosinophils Absolute: 0.3 10*3/uL (ref 0.0–0.5)
HGB: 12 g/dL (ref 11.6–15.9)
MCV: 75.8 fL — ABNORMAL LOW (ref 81.0–101.0)
MONO%: 8 % (ref 0.0–13.0)
NEUT#: 6.7 10*3/uL — ABNORMAL HIGH (ref 1.5–6.5)
RBC: 4.83 10*6/uL (ref 3.70–5.32)
RDW: 14.3 % (ref 11.3–14.5)
WBC: 10.7 10*3/uL — ABNORMAL HIGH (ref 3.9–10.0)
lymph#: 2.8 10*3/uL (ref 0.9–3.3)

## 2008-04-16 LAB — COMPREHENSIVE METABOLIC PANEL
ALT: 14 U/L (ref 0–35)
Albumin: 3.9 g/dL (ref 3.5–5.2)
Alkaline Phosphatase: 81 U/L (ref 39–117)
CO2: 26 mEq/L (ref 19–32)
Potassium: 4 mEq/L (ref 3.5–5.3)
Sodium: 140 mEq/L (ref 135–145)
Total Bilirubin: 0.4 mg/dL (ref 0.3–1.2)
Total Protein: 7.1 g/dL (ref 6.0–8.3)

## 2008-04-16 LAB — CANCER ANTIGEN 27.29: CA 27.29: 14 U/mL (ref 0–39)

## 2008-11-01 ENCOUNTER — Ambulatory Visit: Payer: Self-pay | Admitting: Oncology

## 2008-12-30 ENCOUNTER — Ambulatory Visit: Payer: Self-pay | Admitting: Oncology

## 2009-01-17 LAB — CBC WITH DIFFERENTIAL/PLATELET
Basophils Absolute: 0 10*3/uL (ref 0.0–0.1)
Eosinophils Absolute: 0.2 10*3/uL (ref 0.0–0.5)
HCT: 36.1 % (ref 34.8–46.6)
LYMPH%: 23.3 % (ref 14.0–49.7)
MCV: 76.9 fL — ABNORMAL LOW (ref 79.5–101.0)
MONO#: 0.7 10*3/uL (ref 0.1–0.9)
MONO%: 7.2 % (ref 0.0–14.0)
NEUT#: 6.8 10*3/uL — ABNORMAL HIGH (ref 1.5–6.5)
NEUT%: 67.5 % (ref 38.4–76.8)
Platelets: 312 10*3/uL (ref 145–400)
RBC: 4.7 10*6/uL (ref 3.70–5.45)
WBC: 10 10*3/uL (ref 3.9–10.3)

## 2009-01-18 LAB — COMPREHENSIVE METABOLIC PANEL
Alkaline Phosphatase: 73 U/L (ref 39–117)
BUN: 9 mg/dL (ref 6–23)
CO2: 25 mEq/L (ref 19–32)
Creatinine, Ser: 0.63 mg/dL (ref 0.40–1.20)
Glucose, Bld: 124 mg/dL — ABNORMAL HIGH (ref 70–99)
Sodium: 140 mEq/L (ref 135–145)
Total Bilirubin: 0.3 mg/dL (ref 0.3–1.2)
Total Protein: 7.2 g/dL (ref 6.0–8.3)

## 2009-01-18 LAB — CANCER ANTIGEN 27.29: CA 27.29: 19 U/mL (ref 0–39)

## 2009-01-18 LAB — LACTATE DEHYDROGENASE: LDH: 148 U/L (ref 94–250)

## 2009-01-18 LAB — VITAMIN D 25 HYDROXY (VIT D DEFICIENCY, FRACTURES): Vit D, 25-Hydroxy: 23 ng/mL — ABNORMAL LOW (ref 30–89)

## 2009-01-21 ENCOUNTER — Encounter: Admission: RE | Admit: 2009-01-21 | Discharge: 2009-01-21 | Payer: Self-pay | Admitting: Oncology

## 2009-02-04 ENCOUNTER — Encounter: Admission: RE | Admit: 2009-02-04 | Discharge: 2009-02-04 | Payer: Self-pay | Admitting: Oncology

## 2009-07-11 ENCOUNTER — Ambulatory Visit: Payer: Self-pay | Admitting: Oncology

## 2009-07-13 LAB — CBC WITH DIFFERENTIAL/PLATELET
BASO%: 0.6 % (ref 0.0–2.0)
EOS%: 1.9 % (ref 0.0–7.0)
MCH: 25.6 pg (ref 25.1–34.0)
MCHC: 32.5 g/dL (ref 31.5–36.0)
MONO#: 0.8 10*3/uL (ref 0.1–0.9)
RBC: 4.96 10*6/uL (ref 3.70–5.45)
WBC: 10.8 10*3/uL — ABNORMAL HIGH (ref 3.9–10.3)
lymph#: 2.5 10*3/uL (ref 0.9–3.3)

## 2009-07-13 LAB — COMPREHENSIVE METABOLIC PANEL
ALT: 18 U/L (ref 0–35)
AST: 17 U/L (ref 0–37)
CO2: 29 mEq/L (ref 19–32)
Calcium: 9.5 mg/dL (ref 8.4–10.5)
Chloride: 101 mEq/L (ref 96–112)
Sodium: 138 mEq/L (ref 135–145)
Total Bilirubin: 0.6 mg/dL (ref 0.3–1.2)
Total Protein: 7.7 g/dL (ref 6.0–8.3)

## 2009-07-13 LAB — LACTATE DEHYDROGENASE: LDH: 139 U/L (ref 94–250)

## 2010-01-10 ENCOUNTER — Ambulatory Visit: Payer: Self-pay | Admitting: Oncology

## 2010-01-12 LAB — CBC WITH DIFFERENTIAL/PLATELET
Eosinophils Absolute: 0.3 10*3/uL (ref 0.0–0.5)
HCT: 39.3 % (ref 34.8–46.6)
LYMPH%: 29.8 % (ref 14.0–49.7)
MONO#: 0.7 10*3/uL (ref 0.1–0.9)
NEUT#: 6.3 10*3/uL (ref 1.5–6.5)
NEUT%: 60.4 % (ref 38.4–76.8)
Platelets: 343 10*3/uL (ref 145–400)
WBC: 10.4 10*3/uL — ABNORMAL HIGH (ref 3.9–10.3)

## 2010-01-12 LAB — COMPREHENSIVE METABOLIC PANEL
CO2: 27 mEq/L (ref 19–32)
Calcium: 9.6 mg/dL (ref 8.4–10.5)
Creatinine, Ser: 0.53 mg/dL (ref 0.40–1.20)
Glucose, Bld: 138 mg/dL — ABNORMAL HIGH (ref 70–99)
Sodium: 136 mEq/L (ref 135–145)
Total Bilirubin: 0.9 mg/dL (ref 0.3–1.2)
Total Protein: 7.9 g/dL (ref 6.0–8.3)

## 2010-01-12 LAB — LACTATE DEHYDROGENASE: LDH: 131 U/L (ref 94–250)

## 2010-01-13 LAB — CANCER ANTIGEN 27.29: CA 27.29: 18 U/mL (ref 0–39)

## 2010-02-16 ENCOUNTER — Encounter: Admission: RE | Admit: 2010-02-16 | Discharge: 2010-02-16 | Payer: Self-pay | Admitting: Oncology

## 2010-03-03 ENCOUNTER — Encounter (INDEPENDENT_AMBULATORY_CARE_PROVIDER_SITE_OTHER): Payer: Self-pay | Admitting: Surgery

## 2010-03-03 ENCOUNTER — Ambulatory Visit (HOSPITAL_COMMUNITY): Admission: RE | Admit: 2010-03-03 | Discharge: 2010-03-04 | Payer: Self-pay | Admitting: Surgery

## 2010-05-30 ENCOUNTER — Ambulatory Visit: Payer: Self-pay | Admitting: Oncology

## 2010-06-01 LAB — CBC WITH DIFFERENTIAL/PLATELET
Basophils Absolute: 0.1 10*3/uL (ref 0.0–0.1)
Eosinophils Absolute: 0.3 10*3/uL (ref 0.0–0.5)
HGB: 12.1 g/dL (ref 11.6–15.9)
MCV: 75.7 fL — ABNORMAL LOW (ref 79.5–101.0)
MONO#: 0.7 10*3/uL (ref 0.1–0.9)
MONO%: 7.2 % (ref 0.0–14.0)
NEUT#: 6.3 10*3/uL (ref 1.5–6.5)
RBC: 4.86 10*6/uL (ref 3.70–5.45)
RDW: 16.4 % — ABNORMAL HIGH (ref 11.2–14.5)
WBC: 10.5 10*3/uL — ABNORMAL HIGH (ref 3.9–10.3)

## 2010-06-02 LAB — COMPREHENSIVE METABOLIC PANEL
Albumin: 4.1 g/dL (ref 3.5–5.2)
Alkaline Phosphatase: 70 U/L (ref 39–117)
BUN: 6 mg/dL (ref 6–23)
CO2: 25 mEq/L (ref 19–32)
Calcium: 10 mg/dL (ref 8.4–10.5)
Chloride: 100 mEq/L (ref 96–112)
Glucose, Bld: 122 mg/dL — ABNORMAL HIGH (ref 70–99)
Potassium: 3.5 mEq/L (ref 3.5–5.3)
Sodium: 137 mEq/L (ref 135–145)
Total Protein: 7.6 g/dL (ref 6.0–8.3)

## 2010-06-02 LAB — CANCER ANTIGEN 27.29: CA 27.29: 20 U/mL (ref 0–39)

## 2010-06-02 LAB — LACTATE DEHYDROGENASE: LDH: 127 U/L (ref 94–250)

## 2010-08-27 ENCOUNTER — Encounter: Payer: Self-pay | Admitting: Oncology

## 2010-08-28 ENCOUNTER — Encounter: Payer: Self-pay | Admitting: Oncology

## 2010-10-21 LAB — DIFFERENTIAL
Basophils Absolute: 0.1 10*3/uL (ref 0.0–0.1)
Basophils Relative: 1 % (ref 0–1)
Eosinophils Absolute: 0.2 10*3/uL (ref 0.0–0.7)
Lymphs Abs: 2 10*3/uL (ref 0.7–4.0)
Neutrophils Relative %: 66 % (ref 43–77)

## 2010-10-21 LAB — SURGICAL PCR SCREEN
MRSA, PCR: NEGATIVE
Staphylococcus aureus: NEGATIVE

## 2010-10-21 LAB — BASIC METABOLIC PANEL
BUN: 8 mg/dL (ref 6–23)
Calcium: 9.5 mg/dL (ref 8.4–10.5)
Creatinine, Ser: 0.7 mg/dL (ref 0.4–1.2)
GFR calc non Af Amer: >60 mL/min (ref >60)

## 2010-10-21 LAB — CBC
MCH: 25.6 pg — ABNORMAL LOW (ref 26.0–34.0)
MCHC: 32.8 g/dL (ref 30.0–36.0)
Platelets: 310 10*3/uL (ref 150–400)
RBC: 4.75 MIL/uL (ref 3.87–5.11)
RDW: 15.1 % (ref 11.5–15.5)

## 2010-12-19 NOTE — Op Note (Signed)
Debbie [name], Debbie [name]         ACCOUNT NO.:  [id number]   MEDICAL RECORD NO.:  [id number]          PATIENT TYPE:  AMB   LOCATION:  SDC                           FACILITY:  [facility]   PHYSICIAN:  Michelle L. Grewal, M.D.DATE OF BIRTH:  1951-02-23   DATE OF PROCEDURE:  02/14/2007  DATE OF DISCHARGE:                               OPERATIVE REPORT   PREOPERATIVE DIAGNOSIS:  History of breast cancer and BRCA2 mutation.   POSTOPERATIVE DIAGNOSIS:  History of breast cancer and BRCA2 mutation.   PROCEDURE:  Laparoscopic right salpingo-oophorectomy.   SURGEON:  Dr. Vincente Poli   ANESTHESIA:  General.   SPECIMENS:  Right tube and ovary.   ESTIMATED BLOOD LOSS:  Was minimal.   COMPLICATIONS:  None.   PROCEDURE:  The patient is taken to the operating room and intubated.  She is prepped and draped in usual fashion.  The Foley catheter was  inserted. A small infraumbilical incision was made with a scalpel and a  Veress needle was inserted one time. The Veress needle was removed.  An  11-mm trocar was inserted without difficulty.  There were no adhesions  in the lower abdomen. Secondary 5 mm trocar was inserted suprapubically  under direct visualization. We then inspected the pelvis.  She was  status post hysterectomy and status post left oophorectomy. The left  side pelvis appeared to be normal.  We then went to the right side of  the pelvis and the ovary was normal in appearance as well as the tube.  We then elevated using a grasper, identified the ureter which was well  medial to her ovary and tube, placed the gyrus instrument just beneath  the right tube and ovary and the IP ligament, burned that and carried  all the way across the mesosalpinx down to the round ligament. This was  done with no bleeding at all. After the tube and ovary were freed I then  changed out the 5 mm trocar with a 10 mm one, inserted the Endobag and  placed the specimen inside the Endobag. We then removed everything  through the 10 mm suprapubic port. There was no bleeding whatsoever.  We  then reduced the pneumoperitoneum, removed the scope and the trocar. 0  Vicryl was used to close each incision at the fascial level and the skin  was closed with Dermabond. All sponge, lap and instrument counts were  correct x2.  The patient went to recovery room in stable condition.      Michelle L. Vincente Poli, M.D.  Electronically Signed     MLG/MEDQ  D:  02/14/2007  T:  02/14/2007  Job:  [id number]

## 2011-05-22 LAB — CBC
HCT: 38.8
Platelets: 302
RBC: 4.93
WBC: 9.1

## 2011-05-22 LAB — BASIC METABOLIC PANEL
CO2: 28
Calcium: 9.6
Creatinine, Ser: 0.56
Glucose, Bld: 118 — ABNORMAL HIGH

## 2011-05-22 LAB — PREGNANCY, URINE: Preg Test, Ur: NEGATIVE

## 2011-06-08 DIAGNOSIS — C50919 Malignant neoplasm of unspecified site of unspecified female breast: Secondary | ICD-10-CM

## 2011-06-08 DIAGNOSIS — Z171 Estrogen receptor negative status [ER-]: Secondary | ICD-10-CM

## 2011-06-15 ENCOUNTER — Ambulatory Visit: Payer: Self-pay | Admitting: Oncology

## 2011-08-04 ENCOUNTER — Encounter: Payer: Self-pay | Admitting: Oncology

## 2011-08-04 NOTE — Progress Notes (Signed)
Pt qualified for 100% ind 05/27/11-11/25/11. Family of 1, no income. I received a letter of support from Gaynelle Adu (patient's daughter).Patient received Raj Janus from [facility] $[phone] fall 2011 and [phone] spring 2012.

## 2011-09-11 ENCOUNTER — Other Ambulatory Visit: Payer: Self-pay

## 2011-09-11 DIAGNOSIS — E559 Vitamin D deficiency, unspecified: Secondary | ICD-10-CM

## 2011-09-11 DIAGNOSIS — C50919 Malignant neoplasm of unspecified site of unspecified female breast: Secondary | ICD-10-CM

## 2011-09-12 ENCOUNTER — Other Ambulatory Visit: Payer: Self-pay | Admitting: Oncology

## 2011-09-12 ENCOUNTER — Other Ambulatory Visit (HOSPITAL_BASED_OUTPATIENT_CLINIC_OR_DEPARTMENT_OTHER): Payer: Self-pay | Admitting: Lab

## 2011-09-12 ENCOUNTER — Telehealth: Payer: Self-pay | Admitting: Oncology

## 2011-09-12 DIAGNOSIS — E876 Hypokalemia: Secondary | ICD-10-CM

## 2011-09-12 DIAGNOSIS — C50919 Malignant neoplasm of unspecified site of unspecified female breast: Secondary | ICD-10-CM

## 2011-09-12 DIAGNOSIS — E559 Vitamin D deficiency, unspecified: Secondary | ICD-10-CM

## 2011-09-12 LAB — COMPREHENSIVE METABOLIC PANEL
ALT: 12 U/L (ref 0–35)
AST: 11 U/L (ref 0–37)
Albumin: 3.6 g/dL (ref 3.5–5.2)
BUN: 8 mg/dL (ref 6–23)
CO2: 27 mEq/L (ref 19–32)
Calcium: 9.5 mg/dL (ref 8.4–10.5)
Chloride: 98 mEq/L (ref 96–112)
Creatinine, Ser: 0.61 mg/dL (ref 0.50–1.10)
Potassium: 2.7 mEq/L — CL (ref 3.5–5.3)

## 2011-09-12 LAB — CBC WITH DIFFERENTIAL/PLATELET
Basophils Absolute: 0.1 10*3/uL (ref 0.0–0.1)
EOS%: 2.2 % (ref 0.0–7.0)
Eosinophils Absolute: 0.2 10*3/uL (ref 0.0–0.5)
HCT: 37 % (ref 34.8–46.6)
HGB: 12.1 g/dL (ref 11.6–15.9)
MONO#: 0.7 10*3/uL (ref 0.1–0.9)
NEUT#: 5.7 10*3/uL (ref 1.5–6.5)
NEUT%: 60.2 % (ref 38.4–76.8)
RDW: 15 % — ABNORMAL HIGH (ref 11.2–14.5)
lymph#: 2.7 10*3/uL (ref 0.9–3.3)

## 2011-09-12 NOTE — Telephone Encounter (Signed)
On call: called by [facility] re critical K= of 2.7 on labs done at [facility] ~ 1530 today. Reached pt at home [phone]): she is on lisinopril/HCTZ and no supplemental K+, she is feeling ok. Pt has no insurance and is not employed, has orange card for prescription assistance but cannot use it tonight. I called [facility] then [facility] to check prices, and pt preferred [facility]. Prescription for KCl 20 mEq # 4 tabs called to [facility] [address] with instructions for her to take 1 tablet now, 1 tablet in one hour, and 1 tablet at bedtime tonight, then the last tablet in AM. She is to call Dr.Rubin's RN in AM to get another prescription for K+ to fill on her orange card (~ 20 mEq daily at least until seen back with repeat BMET next week). I have added the order for BMET to her visit next week.

## 2011-09-13 LAB — VITAMIN D 25 HYDROXY (VIT D DEFICIENCY, FRACTURES): Vit D, 25-Hydroxy: 38 ng/mL (ref 30–89)

## 2011-09-14 ENCOUNTER — Telehealth: Payer: Self-pay | Admitting: *Deleted

## 2011-09-14 NOTE — Telephone Encounter (Signed)
Pt. Did not call us yesterday am as Dr. Cleophas Dunker asked.  Called and LM on both her cell and home phone number.  Pt. Called and LM at 2pm that she went to [facility] last night at 7:30pm and they said they did not have a prescription for her.  I then called the [facility] and they said they did have the script ready for her.  Discussed with Dr. Donnie Coffin:  Added more K+ 20 meq BID until pt. Sees him next week.  Called pt. Back and left message to pick up script and take as directed (as she should have taken it last night and then start with BID today.)  Also requested that she call us and let us know who her PCP is so we can send that MD her labs.   Pt. Did not call us.   Called pt back today to see if she picked up her K+ and also LM for her to call us and let us know her PCP.

## 2011-09-19 ENCOUNTER — Telehealth: Payer: Self-pay | Admitting: *Deleted

## 2011-09-19 ENCOUNTER — Other Ambulatory Visit: Payer: Self-pay | Admitting: Lab

## 2011-09-19 ENCOUNTER — Ambulatory Visit (HOSPITAL_BASED_OUTPATIENT_CLINIC_OR_DEPARTMENT_OTHER): Payer: Self-pay | Admitting: Oncology

## 2011-09-19 VITALS — BP 138/76 | HR 68 | Temp 98.3°F | Ht 66.5 in | Wt 261.3 lb

## 2011-09-19 DIAGNOSIS — C50919 Malignant neoplasm of unspecified site of unspecified female breast: Secondary | ICD-10-CM

## 2011-09-19 DIAGNOSIS — E876 Hypokalemia: Secondary | ICD-10-CM

## 2011-09-19 LAB — BASIC METABOLIC PANEL
BUN: 8 mg/dL (ref 6–23)
Chloride: 99 mEq/L (ref 96–112)
Creatinine, Ser: 0.63 mg/dL (ref 0.50–1.10)
Potassium: 3.1 mEq/L — ABNORMAL LOW (ref 3.5–5.3)

## 2011-09-19 NOTE — Progress Notes (Signed)
Hematology and Oncology Follow Up Visit  Debbie [name] [id number] 1950/12/31 61 y.o. 09/19/2011 4:41 PM PCP   Principle Diagnosis: 61 year old woman with history of stage II a breast cancer, triple negative, status post 6 cycles of TAC chemotherapy completed 05/17/2006, status post ration therapy completed 07/16/2006.  history of 2. BRCA2 abnormality status post laparoscopic nephrectomy and hysterectomy. On followup  Interim History:  There have been no intercurrent illness, hospitalizations or medication changes. She was recently found to have a low potassium and we have supplemented her she has followup with her primary care doctor Dr. Philipp Deputy  Medications: I have reviewed the patient's current medications.  Allergies:  Allergies  Allergen Reactions  . Sulfa Antibiotics Rash    Past Medical History, Surgical history, Social history, and Family History were reviewed and updated.  Review of Systems: Constitutional:  Negative for fever, chills, night sweats, anorexia, weight loss, pain. Cardiovascular: no chest pain or dyspnea on exertion Respiratory: no cough, shortness of breath, or wheezing Neurological: negative Dermatological: negative ENT: negative Skin Gastrointestinal: no abdominal pain, change in bowel habits, or black or bloody stools Genito-Urinary: negative Hematological and Lymphatic: negative Breast: negative Musculoskeletal: negative Remaining ROS negative.  Physical Exam: Blood pressure 138/76, pulse 68, temperature 98.3 F (36.8 C), temperature source Oral, height 5' 6.5" (1.689 m), weight 261 lb 4.8 oz (118.525 kg). ECOG: 0 General appearance: alert, cooperative and appears stated age Head: Normocephalic, without obvious abnormality, atraumatic Neck: no adenopathy, no carotid bruit, no JVD, supple, symmetrical, trachea midline and thyroid not enlarged, symmetric, no tenderness/mass/nodules Lymph nodes: Cervical, supraclavicular, and axillary nodes  normal. Cardiac : regular rate and rhythm, no murmurs or gallops Pulmonary:clear to auscultation bilaterally and normal percussion bilaterally Breasts: inspection negative, no nipple discharge or bleeding, no masses or nodularity palpable and She has bilateral mastectomies. There is excessive skin tissue in the lateral portions of her chest wall. I cannot detect any obvious evidence of early recurrence. The right chest wall has a well circumscribed nodule in the middle of the scar this is been described in the past. in addition there is a there is a firm mass present in the skin fold of the left side that was not appreciated in the past. This is somewhat fixed underlying tissue I cannot tell if this represents residual breast tissue and/or muscle tissue but certainly this seems to be well circumscribed and worrisome.  Abdomen:soft, non-tender; bowel sounds normal; no masses,  no organomegaly Extremities negative Neuro: alert, oriented, normal speech, no focal findings or movement disorder noted  Lab Results: Lab Results  Component Value Date   WBC 9.4 09/12/2011   HGB 12.1 09/12/2011   HCT 37.0 09/12/2011   MCV 77.2* 09/12/2011   PLT 313 09/12/2011     Chemistry      Component Value Date/Time   NA 137 09/12/2011 1534   K 2.7* 09/12/2011 1534   CL 98 09/12/2011 1534   CO2 27 09/12/2011 1534   BUN 8 09/12/2011 1534   CREATININE 0.61 09/12/2011 1534      Component Value Date/Time   CALCIUM 9.5 09/12/2011 1534   ALKPHOS 58 09/12/2011 1534   AST 11 09/12/2011 1534   ALT 12 09/12/2011 1534   BILITOT 0.3 09/12/2011 1534      .pathology. Radiological Studies: chest X-ray n/a Mammogram n/a Bone density n/a  Impression and Plan: 61 year old woman with history of BRCA2 abnormality status post chemotherapy bilateral mastectomies and radiation. She is triple negative. I am concerned about some presence  of a possible mass in the left skin fold on. I have sent her for a  CT scan of the chest. I have scheduled for  followup in about 6 months but so that me to see her earlier if this scan is abnormal.  Pierce Crane M.D. FRCP C.   More than 50% of the visit was spent in patient-related counselling   Pierce Crane, MD 2/13/20134:41 PM

## 2011-09-19 NOTE — Telephone Encounter (Signed)
gave patient appointment for ct chest with contrast for 09-25-2011 at 4:00pm gave patient appointment for 03-2012 printed out calendar and gave to the patient

## 2011-09-19 NOTE — Progress Notes (Signed)
Pt. Here to see Dr. Donnie Coffin.  States that Dr. Yisroel Ramming is her PCP.  Labs from 09/12/11 with K of 2.7 faxed.   Phone is [phone]   Fax is [phone]

## 2011-09-24 ENCOUNTER — Other Ambulatory Visit (HOSPITAL_COMMUNITY): Payer: Self-pay

## 2011-09-25 ENCOUNTER — Ambulatory Visit (HOSPITAL_COMMUNITY)
Admission: RE | Admit: 2011-09-25 | Discharge: 2011-09-25 | Disposition: A | Discharge status: Home / Self Care | Payer: Self-pay | Source: Ambulatory Visit | Attending: Oncology | Admitting: Oncology

## 2011-09-25 DIAGNOSIS — R911 Solitary pulmonary nodule: Secondary | ICD-10-CM | POA: Insufficient documentation

## 2011-09-25 DIAGNOSIS — R0789 Other chest pain: Secondary | ICD-10-CM | POA: Insufficient documentation

## 2011-09-25 DIAGNOSIS — Z8541 Personal history of malignant neoplasm of cervix uteri: Secondary | ICD-10-CM | POA: Insufficient documentation

## 2011-09-25 DIAGNOSIS — C50919 Malignant neoplasm of unspecified site of unspecified female breast: Secondary | ICD-10-CM | POA: Insufficient documentation

## 2011-09-25 MED ORDER — IOHEXOL 300 MG/ML  SOLN
100.0000 mL | Freq: Once | INTRAMUSCULAR | Status: AC | PRN
  Administered 2011-09-25: 100 mL via INTRAVENOUS

## 2011-10-03 ENCOUNTER — Other Ambulatory Visit: Payer: Self-pay | Admitting: Obstetrics and Gynecology

## 2011-11-06 ENCOUNTER — Encounter (INDEPENDENT_AMBULATORY_CARE_PROVIDER_SITE_OTHER): Payer: Self-pay | Admitting: Surgery

## 2012-01-03 ENCOUNTER — Encounter: Payer: Self-pay | Admitting: Oncology

## 2012-01-03 NOTE — Progress Notes (Signed)
Patient came by to pick up [employer] financial application.

## 2012-03-18 ENCOUNTER — Ambulatory Visit (HOSPITAL_BASED_OUTPATIENT_CLINIC_OR_DEPARTMENT_OTHER): Payer: Self-pay | Admitting: Family

## 2012-03-18 ENCOUNTER — Telehealth: Payer: Self-pay | Admitting: *Deleted

## 2012-03-18 ENCOUNTER — Other Ambulatory Visit (HOSPITAL_BASED_OUTPATIENT_CLINIC_OR_DEPARTMENT_OTHER): Payer: Self-pay | Admitting: Lab

## 2012-03-18 ENCOUNTER — Encounter: Payer: Self-pay | Admitting: Family

## 2012-03-18 VITALS — BP 121/71 | HR 75 | Temp 98.5°F | Resp 20 | Ht 66.5 in | Wt 258.8 lb

## 2012-03-18 DIAGNOSIS — C50919 Malignant neoplasm of unspecified site of unspecified female breast: Secondary | ICD-10-CM

## 2012-03-18 DIAGNOSIS — E559 Vitamin D deficiency, unspecified: Secondary | ICD-10-CM

## 2012-03-18 DIAGNOSIS — E876 Hypokalemia: Secondary | ICD-10-CM

## 2012-03-18 DIAGNOSIS — Z853 Personal history of malignant neoplasm of breast: Secondary | ICD-10-CM | POA: Insufficient documentation

## 2012-03-18 DIAGNOSIS — Z1505 Genetic susceptibility to malignant neoplasm of fallopian tube(s): Secondary | ICD-10-CM

## 2012-03-18 LAB — COMPREHENSIVE METABOLIC PANEL
Albumin: 3.6 g/dL (ref 3.5–5.2)
BUN: 12 mg/dL (ref 6–23)
CO2: 29 mEq/L (ref 19–32)
Calcium: 9.9 mg/dL (ref 8.4–10.5)
Chloride: 100 mEq/L (ref 96–112)
Glucose, Bld: 121 mg/dL — ABNORMAL HIGH (ref 70–99)
Potassium: 3 mEq/L — ABNORMAL LOW (ref 3.5–5.3)
Sodium: 137 mEq/L (ref 135–145)
Total Protein: 7.4 g/dL (ref 6.0–8.3)

## 2012-03-18 LAB — CBC WITH DIFFERENTIAL/PLATELET
Basophils Absolute: 0.1 10*3/uL (ref 0.0–0.1)
Eosinophils Absolute: 0.7 10*3/uL — ABNORMAL HIGH (ref 0.0–0.5)
HGB: 11.9 g/dL (ref 11.6–15.9)
MCV: 77.3 fL — ABNORMAL LOW (ref 79.5–101.0)
MONO#: 0.8 10*3/uL (ref 0.1–0.9)
NEUT#: 6.4 10*3/uL (ref 1.5–6.5)
RBC: 4.87 10*6/uL (ref 3.70–5.45)
RDW: 15.1 % — ABNORMAL HIGH (ref 11.2–14.5)
WBC: 11.1 10*3/uL — ABNORMAL HIGH (ref 3.9–10.3)

## 2012-03-18 NOTE — Progress Notes (Signed)
Hematology and Oncology Follow Up Visit  Debbie [name] [id number] 04-21-1951 61 y.o. 03/18/2012 4:25 PM PCP   Principle Diagnosis: 61 year old woman with history of stage II a breast cancer, triple negative, status post 6 cycles of TAC chemotherapy completed 05/17/2006, status post ration therapy completed 07/16/2006.  History of  BRCA2 abnormality. Status post laparoscopic nephrectomy and hysterectomy. On followup  Interim History:  There have been no intercurrent illness, hospitalizations or medication changes. Had CT chest after last visit after Dr Donnie Coffin felt an abnormality in the left mastectomy skin fold. CT showed no abnormality.   Feels week with no complaints. No headache or blurred vision. No cough or shortness of breath. No abdominal pain or new bone pain. Bowel and bladder function are normal. Appetite is good, with adequate fluid intake. Remainder of the 10 point  review of systems is negative.  Medications: I have reviewed the patient's current medications.  Allergies:  Allergies  Allergen Reactions  . Sulfa Antibiotics Rash    Past Medical History, Surgical history, Social history, and Family History were reviewed and updated.  Physical Exam: Blood pressure 121/71, pulse 75, temperature 98.5 F (36.9 C), temperature source Oral, resp. rate 20, height 5' 6.5" (1.689 m), weight 258 lb 12.8 oz (117.391 kg). ECOG: 0 General appearance: alert, cooperative and appears stated age Head: Normocephalic, without obvious abnormality, atraumatic Neck: no adenopathy, no carotid bruit, no JVD, supple, symmetrical, trachea midline and thyroid not enlarged, symmetric, no tenderness/mass/nodules Lymph nodes: Cervical, supraclavicular, and axillary nodes normal. Cardiac : regular rate and rhythm, no murmurs or gallops Pulmonary:clear to auscultation bilaterally and normal percussion bilaterally BREAST EXAM: In the supine position, with the right arm over the head, the right breast is  surgically absent. Nodularity underling the lateral aspect of the scar. No redness of the skin. No right axillary adenopathy. With the left arm over the head, the left breast is surgically absent. The lateral end of the scar, a 3.5cm palpable mass. No redness of the skin. No left axillary adenopathy.  Abdomen:soft, non-tender; bowel sounds normal; no masses,  no organomegaly Extremities negative Neuro: alert, oriented, normal speech, no focal findings or movement disorder noted  Lab Results: Lab Results  Component Value Date   WBC 11.1* 03/18/2012   HGB 11.9 03/18/2012   HCT 37.6 03/18/2012   MCV 77.3* 03/18/2012   PLT 303 03/18/2012     Chemistry      Component Value Date/Time   NA 137 03/18/2012 1448   K 3.0* 03/18/2012 1448   CL 100 03/18/2012 1448   CO2 29 03/18/2012 1448   BUN 12 03/18/2012 1448   CREATININE 0.64 03/18/2012 1448      Component Value Date/Time   CALCIUM 9.9 03/18/2012 1448   ALKPHOS 59 03/18/2012 1448   AST 11 03/18/2012 1448   ALT 10 03/18/2012 1448   BILITOT 0.2* 03/18/2012 1448      Impression;  61 year old woman with: 1. History of left triple negative breast cancer with BRCA2 abnormality, status post chemotherapy bilateral mastectomies and radiation, no evidence of recurrence. 2. Palpable abnormalities bilateral mastectomy scars, evaluated by CT scan, likely scar tissue.   Plan: 1. Return in 6 months with Dr. Donnie Coffin with lab prior.    Colman Cater, FNP 03/18/2012   4:25 PM

## 2012-03-18 NOTE — Telephone Encounter (Signed)
Gave patient appointment 09-18-2012 starting at 2:45pm

## 2012-03-18 NOTE — Patient Instructions (Addendum)
Return in 6 months to see Dr. Rubin.  

## 2012-03-19 ENCOUNTER — Encounter: Payer: Self-pay | Admitting: *Deleted

## 2012-03-19 ENCOUNTER — Other Ambulatory Visit: Payer: Self-pay | Admitting: *Deleted

## 2012-03-19 ENCOUNTER — Telehealth: Payer: Self-pay | Admitting: *Deleted

## 2012-03-19 LAB — CANCER ANTIGEN 27.29: CA 27.29: 18 U/mL (ref 0–39)

## 2012-03-19 LAB — VITAMIN D 25 HYDROXY (VIT D DEFICIENCY, FRACTURES): Vit D, 25-Hydroxy: 33 ng/mL (ref 30–89)

## 2012-03-19 MED ORDER — POTASSIUM CHLORIDE CRYS ER 20 MEQ PO TBCR: 20.0000 meq | EXTENDED_RELEASE_TABLET | ORAL | Status: DC | PRN

## 2012-03-19 NOTE — Telephone Encounter (Signed)
Per NR, left pt message to take 2 Klor-con tonight followed by a K+ enriched diet. Pt was instructed to call this desk for additional labs appts and K+ diet suggestions

## 2012-03-20 ENCOUNTER — Encounter: Payer: Self-pay | Admitting: Oncology

## 2012-03-20 ENCOUNTER — Other Ambulatory Visit: Payer: Self-pay | Admitting: *Deleted

## 2012-03-20 MED ORDER — POTASSIUM CHLORIDE CRYS ER 20 MEQ PO TBCR: 20.0000 meq | EXTENDED_RELEASE_TABLET | Freq: Every day | ORAL | Status: DC

## 2012-03-20 NOTE — Progress Notes (Signed)
Patient approve for 100% Discount for a family of one,income 7,456.92  03/20/12 - 09/20/12.

## 2012-03-20 NOTE — Telephone Encounter (Signed)
Per NR, pt is to take 2 tabs8/14/13 and continue with 1 tab daily. Will recheck K+ on next visit. Additional K+ with refills has been called to her pharmacy

## 2012-03-24 NOTE — Telephone Encounter (Signed)
error 

## 2012-09-04 ENCOUNTER — Encounter: Payer: Self-pay | Admitting: *Deleted

## 2012-09-04 NOTE — Progress Notes (Signed)
Called and spoke with patient about rescheduling her appt. Confirmed appt. For 09/12/12 at 0945 with Ernestina Penna.  Patient requests Dr. Welton Flakes.

## 2012-09-05 ENCOUNTER — Encounter: Payer: Self-pay | Admitting: Oncology

## 2012-09-16 ENCOUNTER — Ambulatory Visit (HOSPITAL_BASED_OUTPATIENT_CLINIC_OR_DEPARTMENT_OTHER): Payer: Self-pay | Admitting: Nurse Practitioner

## 2012-09-16 ENCOUNTER — Encounter: Payer: Self-pay | Admitting: Nurse Practitioner

## 2012-09-16 ENCOUNTER — Telehealth: Payer: Self-pay | Admitting: Oncology

## 2012-09-16 VITALS — BP 156/82 | HR 81 | Temp 98.2°F | Resp 20 | Ht 66.5 in | Wt 267.0 lb

## 2012-09-16 DIAGNOSIS — Z853 Personal history of malignant neoplasm of breast: Secondary | ICD-10-CM

## 2012-09-16 DIAGNOSIS — C50919 Malignant neoplasm of unspecified site of unspecified female breast: Secondary | ICD-10-CM

## 2012-09-16 DIAGNOSIS — I10 Essential (primary) hypertension: Secondary | ICD-10-CM

## 2012-09-16 NOTE — Telephone Encounter (Signed)
gv pt appt schedule for February 2015.  °

## 2012-09-16 NOTE — Progress Notes (Signed)
[facility] OFFICE PROGRESS NOTE  No primary provider on file.  DIAGNOSIS: 62 year old woman with history of stage II a breast cancer, triple negative, status post 6 cycles of TAC chemotherapy completed 05/17/2006, status post radiation therapy completed 07/16/2006. History of BRCA2 abnormality. Status post laparoscopic nephrectomy and hysterectomy. On followup   PAST THERAPY: 6 cycle of TAC chemotherapy completed 05/17/2006. Completed XRT 07/16/2006  CURRENT THERAPY: Observation.  INTERVAL HISTORY: Debbie [name] 62 y.o. female returns for regularly scheduled follow up of her breast cancer. She is s/p bilateral mastectomies without reconstruction and overall is doing well. She states she is trying to lose weight, and knows that would help the arthritic pain in her knees. She is concerned because without insurance, her primary care physician requires 100.00 for any visit and she needs her hypertension meds refilled. She also stated she tried to sign up for the Obamacare program online but it would require her paying 700.00 monthly which she doesn't have. She is accompanied by her granddaughter.  MEDICAL HISTORY: Past Medical History  Diagnosis Date  . Breast cancer   . Hypertension   . Diabetes mellitus     SURGICAL HISTORY:  1. Right Lumpectomy 12/04/2005 - invasive mammary carcinoma grade 3, with lymphovascular invasion, ER Neg, PR Neg, HER2 neu Neg, Ki67 - 68% 2. Oophorectomy 02/14/2007 no evidence of malignancy 3. Bilateral mastectomy 03/03/2010 - fat necrosis, no evidence of malignancy either side    MEDICATIONS: Current Outpatient Prescriptions  Medication Sig Dispense Refill  . calcium carbonate (OS-CAL) 600 MG TABS Take 600 mg by mouth daily.      Marland Kitchen lisinopril-hydrochlorothiazide (PRINZIDE,ZESTORETIC) 20-12.5 MG per tablet Take 2 tablets by mouth daily.      . metFORMIN (GLUCOPHAGE) 500 MG tablet Take 500 mg by mouth 2 (two) times daily with a meal.      .  potassium chloride SA (K-DUR,KLOR-CON) 20 MEQ tablet Take 1 tablet (20 mEq total) by mouth daily.  30 tablet  6   No current facility-administered medications for this visit.    ALLERGIES:  is allergic to sulfa antibiotics.  REVIEW OF SYSTEMS: General: denies unexplained weight loss, fatigue, night sweats, fevers, chills HEENT: denies , blurred vision, dizziness, loss of balance - she has occasional headaches which are relieved by Aleve or other OTC meds Cardiac: denies chest pain, pressure or palpitations Lungs: denies wheezing, shortness of breath or productive cough Abd: denies nausea, vomiting, constipation, diarrhea, indigestion, blood in stool or urine Extremities: denies numbness, tingling, swelling but admits to bilateral knee pain  The rest of the 14-point review of system was negative.   There were no vitals filed for this visit. Wt Readings from Last 3 Encounters:  03/18/12 258 lb 12.8 oz (117.391 kg)  09/19/11 261 lb 4.8 oz (118.525 kg)  09/16/12            267 lbs Filed Vitals:   09/16/12 0955  BP: 156/82  Pulse: 81  Temp: 98.2 F (36.8 C)  Resp: 20   ECOG Performance status: 0  PHYSICAL EXAMINATION: 62 year old female  General:  well-nourished in no acute distress.   Eyes:  no scleral icterus.   ENT:  There were no oropharyngeal lesions.  Neck was without thyromegaly.  Lymphatics:  Negative cervical, supraclavicular, axillary, or inguinal adenopathy.  Respiratory: lungs were clear bilaterally without wheezing or crackles.  Cardiovascular:  Regular rate and rhythm, S1/S2, without murmur, rub or gallop.  There was no pedal edema.   Breast:  s/p bilateral mastectomy - In the supine position, with the right arm over the head, the right breast is surgically absent. Nodularity underling the lateral aspect of the scar. No redness of the skin. No right axillary adenopathy. With the left arm over the head, the left breast is surgically absent. The lateral end of the scar, a  3.5cm palpable mass. No redness of the skin. No left axillary adenopathy.  This mass was evaluated by CT scan with no evidence of malignancy. It is unchanged from last office visit.  GI:  abdomen was soft, flat, nontender, nondistended, without organomegaly.   Musculoskeletal:  no spinal tenderness of palpation of vertebral spine.   Skin exam was without echymosis, petichae.   Neuro exam was nonfocal.  Cranial nerves II- XII grossly intact Patient was able to get on and off exam table without assistance.  Gait was normal.  Patient was alerted and oriented.  Attention was good.   Language was appropriate.  Mood was normal without depression.  Speech was not pressured.  Thought content was not tangential.         LABORATORY/RADIOLOGY DATA:  Lab Results  Component Value Date   WBC 11.1* 03/18/2012   HGB 11.9 03/18/2012   HCT 37.6 03/18/2012   PLT 303 03/18/2012   GLUCOSE 121* 03/18/2012   ALT 10 03/18/2012   AST 11 03/18/2012   NA 137 03/18/2012   K 3.0* 03/18/2012   CL 100 03/18/2012   CREATININE 0.64 03/18/2012   BUN 12 03/18/2012   CO2 29 03/18/2012    CT scan 09/25/2011 RADIOLOGY REPORT*  Clinical Data: Follow-up breast carcinoma. Complete chemotherapy.  Previous history of cervical carcinoma. Left chest pain and  palpable abnormality.  CT CHEST WITH CONTRAST  Technique: Multidetector CT imaging of the chest was performed  following the standard protocol during bolus administration of  intravenous contrast.  Contrast: OMNIPAQUE IOHEXOL 300 MG/ML IV SOLN  Comparison: 05/22/2007  Findings: Patient has undergone bilateral mastectomies since  previous study, with left breast reconstruction. There is no  evidence of axillary lymphadenopathy or chest wall soft tissue  masses since prior study. No evidence of mediastinal or hilar  lymphadenopathy. No adenopathy seen elsewhere within the thorax.  There is no evidence of pleural or pericardial effusion. Mild  peripheral right lung  radiation changes are again noted in the  upper lung field, however no suspicious pulmonary nodules are  identified, and there is no evidence of acute infiltrate. Several  tiny less than 5 mm pulmonary nodules in the anterior right midlung  field remain stable. No central endobronchial lesions are  identified.  There is no evidence of pleural or pericardial effusion. No  evidence of mediastinal or hilar lymphadenopathy. Both adrenal  glands are normal in appearance. No suspicious bone lesions are  identified.  IMPRESSION:  1. Stable mild postradiation changes in the peripheral right upper  lung field.  2. No evidence of recurrent or metastatic disease within the  thorax in the visualized portion of the upper abdomen.  Original Report Authenticated By: Danae Orleans, M.D.       ASSESSMENT AND PLAN: 62 year old woman with history of stage IIA breast cancer with right lumpectomy 12/04/2005, triple negative, status post 6 cycles of TAC chemotherapy completed 05/17/2006, status post radiation therapy completed 07/16/2006. History of BRCA2 abnormality. Oophorectomy on 02/14/2007, bilateral mastectomies on 03/03/2010.  Now being followed.  She has a known 3.5cm mass in left mastectomy scar area which was negative for concern by  CT scan in Feb 2013.   We will see her back in 1 year with Dr. Welton Flakes with labs beforehand.   Patient requested assistance with hypertension meds and was given information regarding [facility] for individuals with limited financial means.    Bobbe Medico, AOCNP, NP-C

## 2012-09-18 ENCOUNTER — Other Ambulatory Visit: Payer: Self-pay | Admitting: Lab

## 2012-09-18 ENCOUNTER — Ambulatory Visit: Payer: Self-pay | Admitting: Oncology

## 2013-03-23 ENCOUNTER — Other Ambulatory Visit: Payer: Self-pay | Admitting: Oncology

## 2013-03-23 DIAGNOSIS — C50919 Malignant neoplasm of unspecified site of unspecified female breast: Secondary | ICD-10-CM

## 2013-03-23 DIAGNOSIS — Z1501 Genetic susceptibility to malignant neoplasm of breast: Secondary | ICD-10-CM

## 2013-09-17 ENCOUNTER — Other Ambulatory Visit: Payer: Self-pay

## 2013-09-17 ENCOUNTER — Ambulatory Visit: Payer: Self-pay | Admitting: Oncology

## 2013-10-21 ENCOUNTER — Telehealth: Payer: Self-pay | Admitting: Hematology and Oncology

## 2013-10-21 NOTE — Telephone Encounter (Signed)
FORMER PR PT REASSIGNED TO KK AND MISSED APPT. PT STOPPED BY TO R/S AND REQUESTED NG. OK PER NG AND PT GVIEN APPT FOR 3/27 @ 2:45PM. PER NG NO LB.

## 2013-10-30 ENCOUNTER — Encounter: Payer: Self-pay | Admitting: Hematology and Oncology

## 2013-10-30 ENCOUNTER — Ambulatory Visit (HOSPITAL_BASED_OUTPATIENT_CLINIC_OR_DEPARTMENT_OTHER): Payer: Self-pay | Admitting: Hematology and Oncology

## 2013-10-30 VITALS — BP 152/73 | HR 69 | Temp 97.6°F | Resp 18 | Ht 66.0 in | Wt 263.3 lb

## 2013-10-30 DIAGNOSIS — Z1509 Genetic susceptibility to other malignant neoplasm: Principal | ICD-10-CM

## 2013-10-30 DIAGNOSIS — Z853 Personal history of malignant neoplasm of breast: Secondary | ICD-10-CM

## 2013-10-30 DIAGNOSIS — Z1502 Genetic susceptibility to malignant neoplasm of ovary: Principal | ICD-10-CM

## 2013-10-30 DIAGNOSIS — I10 Essential (primary) hypertension: Secondary | ICD-10-CM

## 2013-10-30 DIAGNOSIS — Z1501 Genetic susceptibility to malignant neoplasm of breast: Secondary | ICD-10-CM

## 2013-10-30 DIAGNOSIS — Z1505 Genetic susceptibility to malignant neoplasm of breast: Secondary | ICD-10-CM

## 2013-10-30 DIAGNOSIS — C50919 Malignant neoplasm of unspecified site of unspecified female breast: Secondary | ICD-10-CM

## 2013-10-30 DIAGNOSIS — E119 Type 2 diabetes mellitus without complications: Secondary | ICD-10-CM

## 2013-10-31 ENCOUNTER — Encounter: Payer: Self-pay | Admitting: Hematology and Oncology

## 2013-10-31 DIAGNOSIS — E119 Type 2 diabetes mellitus without complications: Secondary | ICD-10-CM

## 2013-10-31 HISTORY — DX: Type 2 diabetes mellitus without complications: E11.9

## 2013-10-31 NOTE — Progress Notes (Signed)
[facility] OFFICE PROGRESS NOTE  DIAGNOSIS: BRCA 2 mutation, history of breast cancer, no evidence of disease  SUMMARY OF ONCOLOGIC HISTORY: Oncology History   Breast cancer, BRCA2 positive   Primary site: Breast (Right)   Staging method: AJCC 7th Edition   Clinical: Stage IIA (T2, N0, cM0) signed by Heath Lark, MD on 10/30/2013 11:47 AM   Pathologic: Stage IIA (T2, N0, cM0) signed by Heath Lark, MD on 10/30/2013 11:47 AM   Summary: Stage IIA (T2, N0, cM0)       Breast cancer, BRCA2 positive   10/16/2005 Imaging Screening mammogram came back abnormal with right breast mass   11/07/2005 Imaging Diagnostic mammogram and ultrasound confirmed right breast abnormalities.   11/12/2005 Procedure Ultrasound biopsy confirmed invasive ductal carcinoma, poorly differentiated, ER, PR, HER-2/neu negative   11/20/2005 - 05/23/2006 Chemotherapy The patient completed 6 cycles of TAC adjuvant treatment.   12/04/2005 Surgery She underwent right lumpectomy and sentinel lymph node biopsy.   06/06/2006 - 08/02/2006 Radiation Therapy The patient completed adjuvant radiation therapy.   10/23/2006 Imaging Repeat mammogram show no evidence of malignancy.   02/14/2007 Surgery The patient underwent unilateral salpingo-oophorectomy on the right side which was negative for malignancy.   10/29/2007 Imaging The patient had repeat ultrasound which confirms aroma. Aspiration was done.   03/03/2010 Surgery She underwent prophylactic left mastectomy and right mastectomy for preventive strategy.    INTERVAL HISTORY: Debbie [name] 63 y.o. [name] returns for further followup. She has no concern about disease recurrence. She still had palpable lumps along her mastectomy scars. She has lost insurance coverage recently. She has not been checking her blood pressure or her blood sugar control recently. She denies any recent abnormal breast examination, palpable mass, abnormal breast appearance or nipple changes  I  have reviewed the past medical history, past surgical history, social history and family history with the patient and they are as documented in the history section.   ALLERGIES:  is allergic to sulfa antibiotics.  MEDICATIONS:  Current Outpatient Prescriptions  Medication Sig Dispense Refill  . Cholecalciferol (VITAMIN D3) 5000 UNITS CAPS Take 10,000 Units by mouth daily.      Marland Kitchen ibuprofen (ADVIL,MOTRIN) 200 MG tablet Take 200 mg by mouth as needed.      Marland Kitchen KRILL OIL OMEGA-3 PO Take by mouth daily.      Marland Kitchen lisinopril-hydrochlorothiazide (PRINZIDE,ZESTORETIC) 20-12.5 MG per tablet Take 1 tablet by mouth daily.       . metFORMIN (GLUCOPHAGE) 500 MG tablet Take 500 mg by mouth daily.       . naproxen sodium (ANAPROX) 220 MG tablet Take 220 mg by mouth as needed.       No current facility-administered medications for this visit.    REVIEW OF SYSTEMS:   Constitutional: Denies fevers, chills or abnormal weight loss Eyes: Denies blurriness of vision Ears, nose, mouth, throat, and face: Denies mucositis or sore throat Respiratory: Denies cough, dyspnea or wheezes Cardiovascular: Denies palpitation, chest discomfort or lower extremity swelling Gastrointestinal:  Denies nausea, heartburn or change in bowel habits Skin: Denies abnormal skin rashes Lymphatics: Denies new lymphadenopathy or easy bruising Neurological:Denies numbness, tingling or new weaknesses Behavioral/Psych: Mood is stable, no new changes  All other systems were reviewed with the patient and are negative.  PHYSICAL EXAMINATION: ECOG PERFORMANCE STATUS: 0 - Asymptomatic  Filed Vitals:   10/30/13 1457  BP: 152/73  Pulse: 69  Temp: 97.6 F (36.4 C)  Resp: 18   Filed Weights  10/30/13 1457  Weight: 263 lb 4.8 oz (119.432 kg)    GENERAL:alert, no distress and comfortable. She is morbidly obese SKIN: skin color, texture, turgor are normal, no rashes or significant lesions EYES: normal, Conjunctiva are pink and  non-injected, sclera clear OROPHARYNX:no exudate, no erythema and lips, buccal mucosa, and tongue normal  NECK: supple, thyroid normal size, non-tender, without nodularity LYMPH:  no palpable lymphadenopathy in the cervical, axillary or inguinal LUNGS: clear to auscultation and percussion with normal breathing effort HEART: regular rate & rhythm and no murmurs and no lower extremity edema ABDOMEN:abdomen soft, non-tender and normal bowel sounds Musculoskeletal:no cyanosis of digits and no clubbing  NEURO: alert & oriented x 3 with fluent speech, no focal motor/sensory deficits Bilateral chest wall examination showed a well-healed mastectomy scar. On the right, there are palpable nodules along the surgical scar, consistent with surgical changes. On the left breast, that is presence of seroma along the mastectomy scar. Findings are benign. LABORATORY DATA:  I have reviewed the data as listed    Component Value Date/Time   NA 137 03/18/2012 1448   K 3.0* 03/18/2012 1448   CL 100 03/18/2012 1448   CO2 29 03/18/2012 1448   GLUCOSE 121* 03/18/2012 1448   BUN 12 03/18/2012 1448   CREATININE 0.64 03/18/2012 1448   CALCIUM 9.9 03/18/2012 1448   PROT 7.4 03/18/2012 1448   ALBUMIN 3.6 03/18/2012 1448   AST 11 03/18/2012 1448   ALT 10 03/18/2012 1448   ALKPHOS 59 03/18/2012 1448   BILITOT 0.2* 03/18/2012 1448   GFRNONAA >60 03/03/2010 0754   GFRAA  Value: >60        The eGFR has been calculated using the MDRD equation. This calculation has not been validated in all clinical situations. eGFR's persistently <60 mL/min signify possible Chronic Kidney Disease. 03/03/2010 0754    No results found for this basename: SPEP, UPEP,  kappa and lambda light chains    Lab Results  Component Value Date   WBC 11.1* 03/18/2012   NEUTROABS 6.4 03/18/2012   HGB 11.9 03/18/2012   HCT 37.6 03/18/2012   MCV 77.3* 03/18/2012   PLT 303 03/18/2012      Chemistry      Component Value Date/Time   NA 137 03/18/2012 1448   K 3.0*  03/18/2012 1448   CL 100 03/18/2012 1448   CO2 29 03/18/2012 1448   BUN 12 03/18/2012 1448   CREATININE 0.64 03/18/2012 1448      Component Value Date/Time   CALCIUM 9.9 03/18/2012 1448   ALKPHOS 59 03/18/2012 1448   AST 11 03/18/2012 1448   ALT 10 03/18/2012 1448   BILITOT 0.2* 03/18/2012 1448     ASSESSMENT & PLAN:  #1 right breast cancer, no evidence of disease The patient had completed adjuvant treatment. Continue history and physical examination on a yearly basis #2 BRCA2 mutation The patient has completed preventive strategies with bilateral mastectomies, hysterectomy with bilateral salpingo-oophorectomy. She has inform multiple family members about her genetic mutation. She is up-to-date with screening colonoscopy. #3 diabetes She has no complications right now. Her blood sugar has not been checked. I recommend she gets enrolled in an insurance plan and to get a primary care provider to manage her diabetes #4 hypertension Her blood pressure is a little high. I recommend she continue her current blood pressure regimen for now until she gets establish with a primary care provider.   All questions were answered. The patient knows to call the clinic  with any problems, questions or concerns. No barriers to learning was detected. I spent 15 minutes counseling the patient face to face. The total time spent in the appointment was 20 minutes and more than 50% was on counseling and review of test results     [facility], [place], MD 10/31/2013 12:37 PM

## 2013-11-02 ENCOUNTER — Telehealth: Payer: Self-pay | Admitting: Hematology and Oncology

## 2013-11-02 NOTE — Telephone Encounter (Signed)
s.w. pt and advisedon March 2016 appt....pt ok and aware °

## 2013-12-30 ENCOUNTER — Telehealth: Payer: Self-pay | Admitting: *Deleted

## 2013-12-30 ENCOUNTER — Other Ambulatory Visit: Payer: Self-pay | Admitting: Hematology and Oncology

## 2013-12-30 DIAGNOSIS — I10 Essential (primary) hypertension: Secondary | ICD-10-CM

## 2013-12-30 MED ORDER — LISINOPRIL-HYDROCHLOROTHIAZIDE 20-12.5 MG PO TABS: 1.0000 | ORAL_TABLET | Freq: Every day | ORAL | Status: AC

## 2013-12-30 NOTE — Telephone Encounter (Signed)
done

## 2013-12-30 NOTE — Telephone Encounter (Signed)
Pt requests refill on her Lisinopril-HCTZ 20-12.5 mg sent to [facility] on [address]. States Dr. Alvy Bimler told her she would refill it when she ran out.. Pt has about one week left.

## 2013-12-31 NOTE — Telephone Encounter (Signed)
Informed pt of refill sent to [facility] on [address]. Pt verbalized understanding.

## 2014-10-21 ENCOUNTER — Ambulatory Visit (INDEPENDENT_AMBULATORY_CARE_PROVIDER_SITE_OTHER): Payer: 59 | Admitting: Podiatry

## 2014-10-21 ENCOUNTER — Ambulatory Visit (INDEPENDENT_AMBULATORY_CARE_PROVIDER_SITE_OTHER): Payer: 59

## 2014-10-21 DIAGNOSIS — E1151 Type 2 diabetes mellitus with diabetic peripheral angiopathy without gangrene: Secondary | ICD-10-CM

## 2014-10-21 DIAGNOSIS — M79673 Pain in unspecified foot: Secondary | ICD-10-CM

## 2014-10-21 DIAGNOSIS — Q828 Other specified congenital malformations of skin: Secondary | ICD-10-CM

## 2014-10-21 DIAGNOSIS — B351 Tinea unguium: Secondary | ICD-10-CM

## 2014-10-21 DIAGNOSIS — E119 Type 2 diabetes mellitus without complications: Secondary | ICD-10-CM

## 2014-10-21 NOTE — Progress Notes (Signed)
   Subjective:    Patient ID: Debbie [name], female    DOB: [birth year], 64 y.o.   MRN: [id number]  HPI Comments: "I have a place on the bottom"  Patient c/o tender plantar forefoot bilateral for several months. The area is callused and swollen. The nails are thick and dark. She is diabetic x 3 years. Her last A1C was "good", but doesn't know actually number.     Review of Systems  HENT: Positive for sinus pressure.   Cardiovascular: Positive for leg swelling.  Skin: Positive for rash.  All other systems reviewed and are negative.      Objective:   Physical Exam: I have reviewed her past medical history medications allergies surgery social history and review of systems area pulses are strongly palpable bilateral. Neurologic sensorium is slightly different increased around the digits. Deep tendon reflexes are intact bilaterally muscle strength is 5 over 5 dorsiflexion plantar flexors and inverters everters all just musculature is intact. 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 Radiographs confirm these deformities. She also has a very prominent fourth metatarsal head bilateral. This is resulting in a reactive hyperkeratotic lesion sub-fourth metatarsal head left foot greater than right. Her nails are also thick yellow dystrophic and mycotic and painful to palpation.        Assessment & Plan:  Assessment: Debridement of porokeratotic lesions and painful nails 1 through 5 bilateral.  Plan: Debrided all reactive hyperkeratotic lesions debrided nails bilaterally. We'll follow-up with her in approximately 3 months.

## 2014-11-01 ENCOUNTER — Telehealth: Payer: Self-pay | Admitting: Hematology and Oncology

## 2014-11-01 ENCOUNTER — Encounter: Payer: Self-pay | Admitting: Hematology and Oncology

## 2014-11-01 ENCOUNTER — Ambulatory Visit (HOSPITAL_BASED_OUTPATIENT_CLINIC_OR_DEPARTMENT_OTHER): Payer: 59 | Admitting: Hematology and Oncology

## 2014-11-01 VITALS — BP 142/55 | HR 80 | Temp 98.1°F | Resp 18 | Ht 66.0 in | Wt 248.7 lb

## 2014-11-01 DIAGNOSIS — Z853 Personal history of malignant neoplasm of breast: Secondary | ICD-10-CM | POA: Diagnosis not present

## 2014-11-01 DIAGNOSIS — I890 Lymphedema, not elsewhere classified: Secondary | ICD-10-CM

## 2014-11-01 DIAGNOSIS — Z1501 Genetic susceptibility to malignant neoplasm of breast: Secondary | ICD-10-CM | POA: Diagnosis not present

## 2014-11-01 DIAGNOSIS — C50919 Malignant neoplasm of unspecified site of unspecified female breast: Secondary | ICD-10-CM

## 2014-11-01 DIAGNOSIS — Z1509 Genetic susceptibility to other malignant neoplasm: Principal | ICD-10-CM

## 2014-11-01 DIAGNOSIS — Z1502 Genetic susceptibility to malignant neoplasm of ovary: Principal | ICD-10-CM

## 2014-11-01 NOTE — Progress Notes (Signed)
[place] OFFICE PROGRESS NOTE  Patient Care Team: Glendon Axe, MD as PCP - General (Family Medicine)  SUMMARY OF ONCOLOGIC HISTORY: Oncology History   Breast cancer, BRCA2 positive   Primary site: Breast (Right)   Staging method: AJCC 7th Edition   Clinical: Stage IIA (T2, N0, cM0) signed by Heath Lark, MD on 10/30/2013 11:47 AM   Pathologic: Stage IIA (T2, N0, cM0) signed by Heath Lark, MD on 10/30/2013 11:47 AM   Summary: Stage IIA (T2, N0, cM0)       History of malignant neoplasm of right breast   10/16/2005 Imaging Screening mammogram came back abnormal with right breast mass   11/07/2005 Imaging Diagnostic mammogram and ultrasound confirmed right breast abnormalities.   11/12/2005 Procedure Ultrasound biopsy confirmed invasive ductal carcinoma, poorly differentiated, ER, PR, HER-2/neu negative   11/20/2005 - 05/23/2006 Chemotherapy The patient completed 6 cycles of TAC adjuvant treatment.   12/04/2005 Surgery She underwent right lumpectomy and sentinel lymph node biopsy.   06/06/2006 - 08/02/2006 Radiation Therapy The patient completed adjuvant radiation therapy.   10/23/2006 Imaging Repeat mammogram show no evidence of malignancy.   02/14/2007 Surgery The patient underwent unilateral salpingo-oophorectomy on the right side which was negative for malignancy.   10/29/2007 Imaging The patient had repeat ultrasound which confirms aroma. Aspiration was done.   03/03/2010 Surgery She underwent prophylactic left mastectomy and right mastectomy for preventive strategy.    INTERVAL HISTORY: Please see below for problem oriented charting. She is seen as part of follow-up. She is currently living with her sister due to renovation in her apartment. She has got her insurance back and is starting to see a primary care provider. She has gained a lot of weight and remain have uncontrolled diabetes. She denies any recent abnormal breast examination, palpable mass, abnormal breast  appearance or nipple changes   REVIEW OF SYSTEMS:   Constitutional: Denies fevers, chills or abnormal weight loss Eyes: Denies blurriness of vision Ears, nose, mouth, throat, and face: Denies mucositis or sore throat Respiratory: Denies cough, dyspnea or wheezes Cardiovascular: Denies palpitation, chest discomfort or lower extremity swelling Gastrointestinal:  Denies nausea, heartburn or change in bowel habits Skin: Denies abnormal skin rashes Lymphatics: Denies new lymphadenopathy or easy bruising Neurological:Denies numbness, tingling or new weaknesses Behavioral/Psych: Mood is stable, no new changes  All other systems were reviewed with the patient and are negative.  I have reviewed the past medical history, past surgical history, social history and family history with the patient and they are unchanged from previous note.  ALLERGIES:  is allergic to sulfa antibiotics.  MEDICATIONS:  Current Outpatient Prescriptions  Medication Sig Dispense Refill  . calcium citrate-vitamin D (CITRACAL+D) 315-200 MG-UNIT per tablet Take 1 tablet by mouth 2 (two) times daily.    Marland Kitchen lisinopril-hydrochlorothiazide (PRINZIDE,ZESTORETIC) 20-12.5 MG per tablet Take 1 tablet by mouth daily. 30 tablet 2  . metFORMIN (GLUCOPHAGE) 500 MG tablet Take 500 mg by mouth daily.      No current facility-administered medications for this visit.    PHYSICAL EXAMINATION: ECOG PERFORMANCE STATUS: 1 - Symptomatic but completely ambulatory  Filed Vitals:   11/01/14 1302  BP: 142/55  Pulse: 80  Temp: 98.1 F (36.7 C)  Resp: 18   Filed Weights   11/01/14 1302  Weight: 248 lb 11.2 oz (112.81 kg)    GENERAL:alert, no distress and comfortable. She is morbidly obese SKIN: skin color, texture, turgor are normal, no rashes or significant lesions EYES: normal, Conjunctiva are pink and  non-injected, sclera clear OROPHARYNX:no exudate, no erythema and lips, buccal mucosa, and tongue normal  NECK: supple, thyroid  normal size, non-tender, without nodularity LYMPH:  no palpable lymphadenopathy in the cervical, axillary or inguinal LUNGS: clear to auscultation and percussion with normal breathing effort HEART: regular rate & rhythm and no murmurs and no lower extremity edema ABDOMEN:abdomen soft, non-tender and normal bowel sounds Musculoskeletal:no cyanosis of digits and no clubbing  NEURO: alert & oriented x 3 with fluent speech, no focal motor/sensory deficits Bilateral chest wall examination were performed. Well-healed mastectomy scar. On the left chest wall, there is a large area of lymphedema measure at least the size of a small tennis ball. It is nonpainful on palpation. LABORATORY DATA:  I have reviewed the data as listed    Component Value Date/Time   NA 137 03/18/2012 1448   K 3.0* 03/18/2012 1448   CL 100 03/18/2012 1448   CO2 29 03/18/2012 1448   GLUCOSE 121* 03/18/2012 1448   BUN 12 03/18/2012 1448   CREATININE 0.64 03/18/2012 1448   CALCIUM 9.9 03/18/2012 1448   PROT 7.4 03/18/2012 1448   ALBUMIN 3.6 03/18/2012 1448   AST 11 03/18/2012 1448   ALT 10 03/18/2012 1448   ALKPHOS 59 03/18/2012 1448   BILITOT 0.2* 03/18/2012 1448   GFRNONAA >60 03/03/2010 0754   GFRAA  03/03/2010 0754    >60        The eGFR has been calculated using the MDRD equation. This calculation has not been validated in all clinical situations. eGFR's persistently <60 mL/min signify possible Chronic Kidney Disease.    No results found for: SPEP, UPEP  Lab Results  Component Value Date   WBC 11.1* 03/18/2012   NEUTROABS 6.4 03/18/2012   HGB 11.9 03/18/2012   HCT 37.6 03/18/2012   MCV 77.3* 03/18/2012   PLT 303 03/18/2012      Chemistry      Component Value Date/Time   NA 137 03/18/2012 1448   K 3.0* 03/18/2012 1448   CL 100 03/18/2012 1448   CO2 29 03/18/2012 1448   BUN 12 03/18/2012 1448   CREATININE 0.64 03/18/2012 1448      Component Value Date/Time   CALCIUM 9.9 03/18/2012 1448    ALKPHOS 59 03/18/2012 1448   AST 11 03/18/2012 1448   ALT 10 03/18/2012 1448   BILITOT 0.2* 03/18/2012 1448     ASSESSMENT & PLAN:  History of malignant neoplasm of right breast Clinically, she had no signs of disease recurrence. She does have some palpable lymphedema on the left chest wall. I will refer her back to General Surgery for evaluation and possible aspiration of lymphedema. If she remains cancer free next year, I will discharge her from the clinic. I reinforced the importance of preventive screening program. She will get colonoscopy done soon. I also reminded her about genetic testing in her children and screening program for her children. She has 2 sons and 1 daughter and none of them had screened yet for BRCA mutation.   Lymphedema of breast She has palpable lymphedema on the left chest wall. I will refer her back to Gen. surgery for management.   Morbid obesity The patient is morbidly obese and have significant comorbidities including uncontrolled diabetes. I recommend weight loss and dietary modification. She is motivated to lose at least 15 pounds of weight over the next 12 months until I see her back.     Orders Placed This Encounter  Procedures  . Ambulatory referral  to General Surgery    Referral Priority:  Routine    Referral Type:  Surgical    Referral Reason:  Specialty Services Required    Requested Specialty:  General Surgery    Number of Visits Requested:  1   All questions were answered. The patient knows to call the clinic with any problems, questions or concerns. No barriers to learning was detected. I spent 15 minutes counseling the patient face to face. The total time spent in the appointment was 20 minutes and more than 50% was on counseling and review of test results     [facility], [place], MD 11/01/2014 1:28 PM

## 2014-11-01 NOTE — Assessment & Plan Note (Signed)
The patient is morbidly obese and have significant comorbidities including uncontrolled diabetes. I recommend weight loss and dietary modification. She is motivated to lose at least 15 pounds of weight over the next 12 months until I see her back.

## 2014-11-01 NOTE — Assessment & Plan Note (Signed)
Clinically, she had no signs of disease recurrence. She does have some palpable lymphedema on the left chest wall. I will refer her back to General Surgery for evaluation and possible aspiration of lymphedema. If she remains cancer free next year, I will discharge her from the clinic. I reinforced the importance of preventive screening program. She will get colonoscopy done soon. I also reminded her about genetic testing in her children and screening program for her children. She has 2 sons and 1 daughter and none of them had screened yet for BRCA mutation.

## 2014-11-01 NOTE — Assessment & Plan Note (Signed)
She has palpable lymphedema on the left chest wall. I will refer her back to Gen. surgery for management.

## 2014-11-01 NOTE — Telephone Encounter (Signed)
Gave patient avs report and appointment for march 2017. Patient also given appointment with Dr. Georgette Dover for 4/4 @ 11am.

## 2015-02-03 ENCOUNTER — Ambulatory Visit: Payer: 59 | Admitting: Podiatry

## 2015-10-31 ENCOUNTER — Ambulatory Visit: Payer: 59 | Admitting: Hematology and Oncology

## 2015-10-31 ENCOUNTER — Encounter: Payer: Self-pay | Admitting: *Deleted

## 2015-10-31 ENCOUNTER — Encounter: Payer: Self-pay | Admitting: Hematology and Oncology

## 2015-10-31 NOTE — Progress Notes (Signed)
No Show letter from Dr. Alvy Bimler placed in outgoing mail to pt's home address listed in our EMR.

## 2015-11-04 LAB — GLUCOSE, POCT (MANUAL RESULT ENTRY): POC Glucose: 682 mg/dl — AB (ref 70–99)

## 2015-11-04 LAB — POCT GLYCOSYLATED HEMOGLOBIN (HGB A1C)

## 2016-04-20 LAB — GLUCOSE, POCT (MANUAL RESULT ENTRY): POC GLUCOSE: 151 mg/dL — AB (ref 70–99)

## 2016-07-04 ENCOUNTER — Telehealth: Payer: Self-pay | Admitting: Hematology and Oncology

## 2016-07-04 NOTE — Telephone Encounter (Signed)
FAXED PT RECORDS TO [facility] RELEASE ID [id number]

## 2016-07-10 ENCOUNTER — Telehealth: Payer: Self-pay | Admitting: *Deleted

## 2016-07-10 ENCOUNTER — Other Ambulatory Visit: Payer: Self-pay | Admitting: Hematology and Oncology

## 2016-07-10 NOTE — Telephone Encounter (Signed)
-----   Message from Heath Lark, MD sent at 07/10/2016 10:14 AM EST ----- Regarding: FW: est patient Can you call her and see if she can come in to be seen at 330 pm today? If not I can see her tomorrow at 1145 am. 30 mins appt pls, no tests ----- Message ----- From: Luiz Ochoa Sent: 07/10/2016   9:56 AM To: Heath Lark, MD, Luiz Ochoa, # Subject: est patient                                    Dr. Alvy Bimler  We recently received a new pt referral for one of your Est patients. Info is in Media, pls forward any appointments to schedulers.  Thanks [employer]

## 2016-07-10 NOTE — Telephone Encounter (Signed)
Pt will come Wednesday @ 1145  Msg to scheduler

## 2016-07-11 ENCOUNTER — Encounter: Payer: Self-pay | Admitting: Hematology and Oncology

## 2016-07-11 ENCOUNTER — Telehealth: Payer: Self-pay | Admitting: *Deleted

## 2016-07-11 ENCOUNTER — Ambulatory Visit (HOSPITAL_BASED_OUTPATIENT_CLINIC_OR_DEPARTMENT_OTHER): Payer: Medicare HMO | Admitting: Hematology and Oncology

## 2016-07-11 ENCOUNTER — Other Ambulatory Visit: Payer: Self-pay | Admitting: *Deleted

## 2016-07-11 VITALS — BP 171/78 | HR 65 | Temp 97.6°F | Resp 15 | Ht 66.0 in | Wt 254.0 lb

## 2016-07-11 DIAGNOSIS — I10 Essential (primary) hypertension: Secondary | ICD-10-CM

## 2016-07-11 DIAGNOSIS — Z853 Personal history of malignant neoplasm of breast: Secondary | ICD-10-CM

## 2016-07-11 DIAGNOSIS — E119 Type 2 diabetes mellitus without complications: Secondary | ICD-10-CM

## 2016-07-11 NOTE — Assessment & Plan Note (Signed)
The patient has cardiovascular risk factors including morbid obesity, hypertension and diabetes. She appears motivated to exercise and lose weight. I encouraged her to try her best to increase exercise activity along with dietary modification.

## 2016-07-11 NOTE — Telephone Encounter (Signed)
[place] notified of appts:  Dr Georgette Dover at [facility]  January 4 @ 2:40pm Office notes faxed to [place] December 19 @ [phone]

## 2016-07-11 NOTE — Progress Notes (Signed)
[place] OFFICE PROGRESS NOTE  Patient Care Team: Glendon Axe, MD as PCP - General (Family Medicine) Donnie Mesa, MD as Consulting Physician (General Surgery)  SUMMARY OF ONCOLOGIC HISTORY: Oncology History   Breast cancer, BRCA2 positive   Primary site: Breast (Right)   Staging method: AJCC 7th Edition   Clinical: Stage IIA (T2, N0, cM0) signed by Heath Lark, MD on 10/30/2013 11:47 AM   Pathologic: Stage IIA (T2, N0, cM0) signed by Heath Lark, MD on 10/30/2013 11:47 AM   Summary: Stage IIA (T2, N0, cM0)       History of malignant neoplasm of right breast   10/16/2005 Imaging    Screening mammogram came back abnormal with right breast mass      11/07/2005 Imaging    Diagnostic mammogram and ultrasound confirmed right breast abnormalities.      11/12/2005 Procedure    Ultrasound biopsy confirmed invasive ductal carcinoma, poorly differentiated, ER, PR, HER-2/neu negative      11/20/2005 - 05/23/2006 Chemotherapy    The patient completed 6 cycles of TAC adjuvant treatment.      12/04/2005 Surgery    She underwent right lumpectomy and sentinel lymph node biopsy.      06/06/2006 - 08/02/2006 Radiation Therapy    The patient completed adjuvant radiation therapy.      10/23/2006 Imaging    Repeat mammogram show no evidence of malignancy.      02/14/2007 Surgery    The patient underwent unilateral salpingo-oophorectomy on the right side which was negative for malignancy.      10/29/2007 Imaging    The patient had repeat ultrasound which confirms aroma. Aspiration was done.      03/03/2010 Surgery    She underwent prophylactic left mastectomy and right mastectomy for preventive strategy.       INTERVAL HISTORY: Please see below for problem oriented charting. She is referred back here by primary care doctor due to palpable new nodules along the chest wall. The patient is not symptomatic. She was undergoing routine follow-up with primary care related to  diabetes and hypertension. She has excessive weight gain recently after the holidays. She denies bone pain. She have occasional dizziness. No new neurological deficits. Denies recent infection  REVIEW OF SYSTEMS:   Constitutional: Denies fevers, chills or abnormal weight loss Eyes: Denies blurriness of vision Ears, nose, mouth, throat, and face: Denies mucositis or sore throat Respiratory: Denies cough, dyspnea or wheezes Cardiovascular: Denies palpitation, chest discomfort or lower extremity swelling Gastrointestinal:  Denies nausea, heartburn or change in bowel habits Skin: Denies abnormal skin rashes Lymphatics: Denies new lymphadenopathy or easy bruising Neurological:Denies numbness, tingling or new weaknesses Behavioral/Psych: Mood is stable, no new changes  All other systems were reviewed with the patient and are negative.  I have reviewed the past medical history, past surgical history, social history and family history with the patient and they are unchanged from previous note.  ALLERGIES:  is allergic to sulfa antibiotics.  MEDICATIONS:  Current Outpatient Prescriptions  Medication Sig Dispense Refill  . Digestive Enzymes (PAPAYA AND ENZYMES PO) Take by mouth.    Marland Kitchen lisinopril-hydrochlorothiazide (PRINZIDE,ZESTORETIC) 20-12.5 MG per tablet Take 1 tablet by mouth daily. 30 tablet 2  . Melatonin 3-10 MG TABS Take by mouth.    . metFORMIN (GLUCOPHAGE) 500 MG tablet Take 500 mg by mouth daily.     . calcium citrate-vitamin D (CITRACAL+D) 315-200 MG-UNIT per tablet Take 1 tablet by mouth 2 (two) times daily.  No current facility-administered medications for this visit.     PHYSICAL EXAMINATION: ECOG PERFORMANCE STATUS: 0 - Asymptomatic  Vitals:   07/11/16 1151  BP: (!) 171/78  Pulse: 65  Resp: 15  Temp: 97.6 F (36.4 C)   Filed Weights   07/11/16 1151  Weight: 254 lb (115.2 kg)    GENERAL:alert, no distress and comfortable. She is moderately obese SKIN: skin  color, texture, turgor are normal, no rashes or significant lesions EYES: normal, Conjunctiva are pink and non-injected, sclera clear OROPHARYNX:no exudate, no erythema and lips, buccal mucosa, and tongue normal  NECK: supple, thyroid normal size, non-tender, without nodularity LYMPH:  no palpable lymphadenopathy in the cervical, axillary or inguinal LUNGS: clear to auscultation and percussion with normal breathing effort HEART: regular rate & rhythm and no murmurs and no lower extremity edema ABDOMEN:abdomen soft, non-tender and normal bowel sounds Musculoskeletal:no cyanosis of digits and no clubbing  NEURO: alert & oriented x 3 with fluent speech, no focal motor/sensory deficits Bilateral chest wall examination were performed. Well-healed bilateral mastectomy scars. On the right breast, there is palpable nodules along the mastectomy scar. On the left breast, she had evidence of seroma  LABORATORY DATA:  I have reviewed the data as listed    Component Value Date/Time   NA 137 03/18/2012 1448   K 3.0 (L) 03/18/2012 1448   CL 100 03/18/2012 1448   CO2 29 03/18/2012 1448   GLUCOSE 121 (H) 03/18/2012 1448   BUN 12 03/18/2012 1448   CREATININE 0.64 03/18/2012 1448   CALCIUM 9.9 03/18/2012 1448   PROT 7.4 03/18/2012 1448   ALBUMIN 3.6 03/18/2012 1448   AST 11 03/18/2012 1448   ALT 10 03/18/2012 1448   ALKPHOS 59 03/18/2012 1448   BILITOT 0.2 (L) 03/18/2012 1448   GFRNONAA >60 03/03/2010 0754   GFRAA  03/03/2010 0754    >60        The eGFR has been calculated using the MDRD equation. This calculation has not been validated in all clinical situations. eGFR's persistently <60 mL/min signify possible Chronic Kidney Disease.    No results found for: SPEP, UPEP  Lab Results  Component Value Date   WBC 11.1 (H) 03/18/2012   NEUTROABS 6.4 03/18/2012   HGB 11.9 03/18/2012   HCT 37.6 03/18/2012   MCV 77.3 (L) 03/18/2012   PLT 303 03/18/2012      Chemistry      Component  Value Date/Time   NA 137 03/18/2012 1448   K 3.0 (L) 03/18/2012 1448   CL 100 03/18/2012 1448   CO2 29 03/18/2012 1448   BUN 12 03/18/2012 1448   CREATININE 0.64 03/18/2012 1448      Component Value Date/Time   CALCIUM 9.9 03/18/2012 1448   ALKPHOS 59 03/18/2012 1448   AST 11 03/18/2012 1448   ALT 10 03/18/2012 1448   BILITOT 0.2 (L) 03/18/2012 1448      ASSESSMENT & PLAN:  History of malignant neoplasm of right breast The patient is a long-time cancer survivor. However, examination revealed small nodules along the mastectomy scar, indeterminate etiology. I will order diagnostic ultrasound of the breasts for evaluation and refer her back to general surgery for evaluation and possible biopsy. She agreed with the plan of care  Morbid obesity The patient has cardiovascular risk factors including morbid obesity, hypertension and diabetes. She appears motivated to exercise and lose weight. I encouraged her to try her best to increase exercise activity along with dietary modification.   Orders   Placed This Encounter  Procedures  . US BREAST COMPLETE UNI RIGHT INC AXILLA    Standing Status:   Future    Standing Expiration Date:   09/11/2017    Order Specific Question:   Reason for Exam (SYMPTOM  OR DIAGNOSIS REQUIRED)    Answer:   History breast ca s/p mastectomy, new skin nodules, exclude cancer    Order Specific Question:   Preferred imaging location?    Answer:   [facility]   All questions were answered. The patient knows to call the clinic with any problems, questions or concerns. No barriers to learning was detected. I spent 15 minutes counseling the patient face to face. The total time spent in the appointment was 20 minutes and more than 50% was on counseling and review of test results     Ni Gorsuch, MD 07/11/2016 12:27 PM   

## 2016-07-11 NOTE — Assessment & Plan Note (Signed)
The patient is a long-time cancer survivor. However, examination revealed small nodules along the mastectomy scar, indeterminate etiology. I will order diagnostic ultrasound of the breasts for evaluation and refer her back to general surgery for evaluation and possible biopsy. She agreed with the plan of care

## 2016-07-13 ENCOUNTER — Ambulatory Visit: Payer: Medicare HMO | Admitting: Hematology and Oncology

## 2016-07-24 ENCOUNTER — Other Ambulatory Visit: Payer: Medicare HMO

## 2016-08-08 ENCOUNTER — Other Ambulatory Visit: Payer: Medicare HMO

## 2016-08-09 ENCOUNTER — Ambulatory Visit: Payer: Medicare HMO | Admitting: Hematology and Oncology

## 2016-08-23 ENCOUNTER — Other Ambulatory Visit: Payer: Medicare HMO

## 2016-10-17 ENCOUNTER — Emergency Department (HOSPITAL_COMMUNITY): Payer: Medicare HMO

## 2016-10-17 ENCOUNTER — Emergency Department (HOSPITAL_COMMUNITY)
Admission: EM | Admit: 2016-10-17 | Discharge: 2016-10-17 | Disposition: A | Discharge status: Home / Self Care | Payer: Medicare HMO | Attending: Emergency Medicine | Admitting: Emergency Medicine

## 2016-10-17 ENCOUNTER — Encounter (HOSPITAL_COMMUNITY): Payer: Self-pay | Admitting: *Deleted

## 2016-10-17 DIAGNOSIS — Z7984 Long term (current) use of oral hypoglycemic drugs: Secondary | ICD-10-CM | POA: Insufficient documentation

## 2016-10-17 DIAGNOSIS — Y999 Unspecified external cause status: Secondary | ICD-10-CM | POA: Diagnosis not present

## 2016-10-17 DIAGNOSIS — Y929 Unspecified place or not applicable: Secondary | ICD-10-CM | POA: Diagnosis not present

## 2016-10-17 DIAGNOSIS — M25552 Pain in left hip: Secondary | ICD-10-CM | POA: Diagnosis not present

## 2016-10-17 DIAGNOSIS — X58XXXA Exposure to other specified factors, initial encounter: Secondary | ICD-10-CM | POA: Insufficient documentation

## 2016-10-17 DIAGNOSIS — I10 Essential (primary) hypertension: Secondary | ICD-10-CM | POA: Diagnosis not present

## 2016-10-17 DIAGNOSIS — E119 Type 2 diabetes mellitus without complications: Secondary | ICD-10-CM | POA: Insufficient documentation

## 2016-10-17 DIAGNOSIS — Z853 Personal history of malignant neoplasm of breast: Secondary | ICD-10-CM | POA: Diagnosis not present

## 2016-10-17 DIAGNOSIS — Y939 Activity, unspecified: Secondary | ICD-10-CM | POA: Diagnosis not present

## 2016-10-17 MED ORDER — HYDROCODONE-ACETAMINOPHEN 5-325 MG PO TABS: 1.0000 | ORAL_TABLET | Freq: Four times a day (QID) | ORAL | 0 refills | Status: DC | PRN

## 2016-10-17 MED ORDER — OXYCODONE-ACETAMINOPHEN 5-325 MG PO TABS
1.0000 | ORAL_TABLET | Freq: Once | ORAL | Status: AC
  Administered 2016-10-17: 1 via ORAL
  Filled 2016-10-17: qty 1

## 2016-10-17 NOTE — ED Provider Notes (Signed)
[facility] DEPT Provider Note   CSN: [id number] Arrival date & time: 10/17/16  4196  By signing my name below, I, Ethelle Lyon Long, attest that this documentation has been prepared under the direction and in the presence of Jaime P. Ward, PA-C. Electronically Signed: Ethelle Lyon Long, Scribe. 10/17/2016. 9:49 AM.  History   Chief Complaint Chief Complaint  Patient presents with  . Hip Pain   The history is provided by the patient and medical records. No language interpreter was used.    HPI Comments:  Debbie [name] is a 66 y.o. female with a PMHx of Breat CA, HTN, and DM-2, who presents to the Emergency Department complaining of gradually worsening, left-sided, 6/10 hip/buttock pain onset two weeks ago.She states the pain began two weeks ago at the [facility] after walking two miles and doing mild weight training. Over the past week the pain has gradually worsened with her symptoms worsening at night as well as waking her up throughout the night due to pain. She has not had this type of pain before. She states ambulation and coughing exacerbates her pain. She tried 500mg  Tylenol and Ibuprofen at home with minimal relief of her pain. Pt denies back pain, numbness, tingling, difficulty urinating, dysuria, bowel or bladder incontinence or any other complaints at this time.  Past Medical History:  Diagnosis Date  . Breast cancer ([place])   . Diabetes mellitus   . Hypertension   . Type II or unspecified type diabetes mellitus without mention of complication, not stated as uncontrolled 10/31/2013    Patient Active Problem List   Diagnosis Date Noted  . 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 Morbid obesity ([place]) 11/01/2014  . Type II or unspecified type diabetes mellitus without mention of complication, not stated as uncontrolled 10/31/2013  . History of malignant neoplasm of right breast 03/18/2012    Past Surgical History:  Procedure Laterality Date  . ABDOMINAL HYSTERECTOMY    .  BILATERAL TOTAL MASTECTOMY WITH AXILLARY LYMPH NODE DISSECTION    . left ankle surgery    . TONSILLECTOMY      OB History    No data available       Home Medications    Prior to Admission medications   Medication Sig Start Date End Date Taking? Authorizing Provider  calcium citrate-vitamin D (CITRACAL+D) 315-200 MG-UNIT per tablet Take 1 tablet by mouth 2 (two) times daily.    Historical Provider, MD  Digestive Enzymes (PAPAYA AND ENZYMES PO) Take by mouth.    Historical Provider, MD  lisinopril-hydrochlorothiazide (PRINZIDE,ZESTORETIC) 20-12.5 MG per tablet Take 1 tablet by mouth daily. 12/30/13   Heath Lark, MD  Melatonin 3-10 MG TABS Take by mouth.    Historical Provider, MD  metFORMIN (GLUCOPHAGE) 500 MG tablet Take 500 mg by mouth daily.     Historical Provider, MD    Family History Family History  Problem Relation Age of Onset  . Cancer Mother     bladder cancer  . Cancer Sister     unknown cancer  . Cancer Maternal Grandmother     scalp/skull cancer    Social History Social History  Substance Use Topics  . Smoking status: Never Smoker  . Smokeless tobacco: Never Used  . Alcohol use No     Allergies   Sulfa antibiotics   Review of Systems Review of Systems  Genitourinary: Negative for difficulty urinating and dysuria.  Musculoskeletal: Positive for arthralgias and myalgias.  Neurological: Negative for numbness.     Physical  Exam Updated Vital Signs BP 161/70 (BP Location: Left Arm)   Pulse 63   Temp 97.7 F (36.5 C) (Oral)   Resp 19   SpO2 98%   Physical Exam  Constitutional: She appears well-developed and well-nourished. No distress.  HENT:  Head: Normocephalic and atraumatic.  Neck: Neck supple.  Cardiovascular: Normal rate, regular rhythm and normal heart sounds.   No murmur heard. Pulmonary/Chest: Effort normal and breath sounds normal. No respiratory distress. She has no wheezes.  Abdominal: Soft. She exhibits no distension. There is no  tenderness.  Musculoskeletal: Normal range of motion.  No midline tenderness. TTP along the left lateral hip and buttocks. No overlying skin changes. Straight leg raises are negative bilaterally.   Neurological: She is alert.  Skin: Skin is warm and dry.  Nursing note and vitals reviewed.    ED Treatments / Results  DIAGNOSTIC STUDIES:  Oxygen Saturation is 98% on RA, normal by my interpretation.    COORDINATION OF CARE:  9:48 AM Discussed treatment plan with pt at bedside including XR of left hip and Lumbar spine and pt agreed to plan.  Labs (all labs ordered are listed, but only abnormal results are displayed) Labs Reviewed - No data to display  EKG  EKG Interpretation None       Radiology No results found.  Procedures Procedures (including critical care time)  Medications Ordered in ED Medications - No data to display   Initial Impression / Assessment and Plan / ED Course  I have reviewed the triage vital signs and the nursing notes.  Pertinent labs & imaging results that were available during my care of the patient were reviewed by me and considered in my medical decision making (see chart for details).     Debbie [name] is a 66 y.o. female who presents to ED for worsening left hip / buttock pain x 1-2 weeks that began the day after an increase in activity while at the [facility]. Plain film of the lumbar spine and hip negative for acute injury. Patient demonstrates no other extremity weakness, saddle anesthesia, bowel or bladder incontinence or neuro deficits. No concern for cauda equina. No fever or infectious symptoms to suggest patient's back or hip pain is due to an infection. Lower extremities are neurovascularly intact and patient was able to ambulate in the ED today without assistance. Discussed continuation of NSAID's and home care instructions. Follow up with ortho or sports medicine. Patient and family at bedside encouraged to call today or tomorrow to  schedule this follow up appointment. Return precautions discussed and all questions answered.    Patient discussed with Dr. Venora Maples who agrees with treatment plan.    Final Clinical Impressions(s) / ED Diagnoses   Final diagnoses:  None    New Prescriptions New Prescriptions   No medications on file   I personally performed the services described in this documentation, which was scribed in my presence. The recorded information has been reviewed and is accurate.     [facility] Ward, PA-C 10/17/16 [place], MD 10/17/16 [phone]

## 2016-10-17 NOTE — ED Triage Notes (Signed)
Pt reports recent exercise, has left hip/buttock pain that radiates down back of her left leg. Unable to sleep at night.

## 2016-10-17 NOTE — Discharge Instructions (Signed)
Please call the orthopedist listed or the clinic listed below to schedule a follow-up appointment. Continue Tylenol or ibuprofen as needed for pain. Take pain medication only as needed for severe pain-  This can make you very drowsy - please do not drink alcohol, operate heavy machinery or drive on this medication. Return to the ER for new or worsening symptoms, any additional concerns.    [place], [place]  Ph [phone]  Fax [phone]

## 2016-10-17 NOTE — ED Notes (Signed)
Pt able to ambulate slowly- favoring left side due to pain

## 2017-11-25 ENCOUNTER — Telehealth: Payer: Self-pay | Admitting: Hematology and Oncology

## 2017-11-25 NOTE — Telephone Encounter (Signed)
Appointment scheduled.  Tried calling patient no answer or voicemail ever came on.  Letter/calendar mailed to patient

## 2017-12-09 ENCOUNTER — Inpatient Hospital Stay: Payer: Medicare HMO | Attending: Hematology and Oncology | Admitting: Hematology and Oncology

## 2017-12-09 ENCOUNTER — Encounter: Payer: Self-pay | Admitting: Hematology and Oncology

## 2017-12-09 ENCOUNTER — Telehealth: Payer: Self-pay | Admitting: Hematology and Oncology

## 2017-12-09 VITALS — BP 154/81 | HR 73 | Temp 98.3°F | Resp 18 | Ht 66.0 in | Wt 238.1 lb

## 2017-12-09 DIAGNOSIS — I890 Lymphedema, not elsewhere classified: Secondary | ICD-10-CM | POA: Diagnosis not present

## 2017-12-09 DIAGNOSIS — Z8543 Personal history of malignant neoplasm of ovary: Secondary | ICD-10-CM | POA: Insufficient documentation

## 2017-12-09 DIAGNOSIS — Z853 Personal history of malignant neoplasm of breast: Secondary | ICD-10-CM | POA: Diagnosis not present

## 2017-12-09 DIAGNOSIS — Z1501 Genetic susceptibility to malignant neoplasm of breast: Secondary | ICD-10-CM | POA: Diagnosis not present

## 2017-12-09 DIAGNOSIS — E119 Type 2 diabetes mellitus without complications: Secondary | ICD-10-CM

## 2017-12-09 DIAGNOSIS — Z1509 Genetic susceptibility to other malignant neoplasm: Secondary | ICD-10-CM | POA: Diagnosis not present

## 2017-12-09 NOTE — Patient Instructions (Signed)
Olaparib oral capsules What is this medicine? OLAPARIB (oh LA pa rib) is a chemotherapy drug. It targets specific enzymes within cancer cells and stops the cancer cell from growing. This medicine is used to treat ovarian cancer. This medicine may be used for other purposes; ask your health care provider or pharmacist if you have questions. COMMON BRAND NAME(S): Lynparza What should I tell my health care provider before I take this medicine? They need to know if you have any of these conditions: -anemia -kidney disease -liver disease -lung disease -low blood counts, like low white cell, platelet, or red cell counts -an unusual or allergic reaction to olaparib, other medicines, foods, dyes, or preservatives -pregnant or trying to get pregnant -breast-feeding How should I use this medicine? Take this medicine by mouth with a glass of water. Follow the directions on the prescription label. Do not cut, crush, or chew this medicine. You can take it with or without food. If it upsets your stomach, take it with food. However, avoid grapefruit juice, grapefruit or [place] oranges while on this medicine. Take your medicine at regular intervals. Do not take it more often than directed. Do not stop taking except on your doctor's advice. A special MedGuide will be given to you by the pharmacist with each prescription and refill. Be sure to read this information carefully each time. Talk to your pediatrician regarding the use of this medicine in children. Special care may be needed. Overdosage: If you think you have taken too much of this medicine contact a poison control center or emergency room at once. NOTE: This medicine is only for you. Do not share this medicine with others. What if I miss a dose? If you miss a dose, take it as soon as you can. If it is almost time for your next dose, take only that dose. Do not take double or extra doses. What may interact with this medicine? -antiviral medicines  for hepatitis, HIV or AIDS -aprepitant -boceprevir -bosentan -carbamazepine -certain medicines for fungal infections like fluconazole, ketoconazole, itraconazole, posaconazole, and voriconazole -certain medicines for infections, such as ciprofloxacin, clarithromycin, erythromycin, telithromycin -crizotinib -diltiazem -grapefruit juice -imatinib -modafinil -nafcillin -nefazodone -phenobarbital -phenytoin -rifampin -[place] oranges -St. John's Wort -telaprevir -verapamil This list may not describe all possible interactions. Give your health care provider a list of all the medicines, herbs, non-prescription drugs, or dietary supplements you use. Also tell them if you smoke, drink alcohol, or use illegal drugs. Some items may interact with your medicine. What should I watch for while using this medicine? This drug may make you feel generally unwell. This is not uncommon, as chemotherapy can affect healthy cells as well as cancer cells. Report any side effects. Continue your course of treatment even though you feel ill unless your doctor tells you to stop. You will need blood work done while you are taking this medicine. This medicine may increase your risk to bruise or bleed. Call your doctor or health care professional if you notice any unusual bleeding. Call your doctor or health care professional for advice if you get a fever, chills or sore throat, or other symptoms of a cold or flu. Do not treat yourself. This drug decreases your body's ability to fight infections. Try to avoid being around people who are sick. If you are going to have surgery or any other procedures, tell your doctor you are taking this medicine. Do not become pregnant while taking this medicine or for 6 months after the last dose. Women should   inform their doctor if they wish to become pregnant or think they might be pregnant. Men should not father a child while taking this medicine and for 3 months after stopping it.  Do not donate sperm while taking this medicine and for 3 months after you stop taking this medicine. There is a potential for serious side effects to an unborn child. Talk to your health care professional or pharmacist for more information. Women who are able to become pregnant should use effective birth control during treatment and for at least 6 months after receiving the last dose. Talk to your healthcare provider about birth control methods that may be right for you. Do not breast-feed an infant while taking this medicine or for 1 month after the last dose. What side effects may I notice from receiving this medicine? Side effects that you should report to your doctor or health care professional as soon as possible: -allergic reactions like skin rash, itching or hives, swelling of the face, lips, or tongue -breathing problems, like shortness of breath, cough, or wheezing -fever -low blood counts - this medicine may decrease the number of white blood cells, red blood cells and platelets. You may be at increased risk for infections and bleeding. -signs and symptoms of bleeding such as bloody or black, tarry stools; red or dark-brown urine; spitting up blood or brown material that looks like coffee grounds; red spots on the skin; unusual bruising or bleeding from the eye, gums, or nose -signs and symptoms of a blood clot such as changes in vision; chest pain with breathing problems; severe, sudden headache; pain, swelling, warmth in the leg; trouble speaking; sudden numbness or weakness of the face, arm or leg -signs and symptoms of low magnesium like muscle cramps or muscle pain; tingling or tremors; muscle weakness; seizures; or fast, irregular heartbeat -signs and symptoms of infection like fever or chills; cough; sore throat; pain or trouble passing urine -weak or tired Side effects that usually do not require medical attention (report to your doctor or health care professional if they continue or  are bothersome): -changes in taste -diarrhea -headache -heartburn, indigestion -loss of appetite -muscle or joint pain -nausea/vomiting -runny nose -stomach pain -weight loss This list may not describe all possible side effects. Call your doctor for medical advice about side effects. You may report side effects to FDA at 1-800-FDA-1088. Where should I keep my medicine? Keep out of the reach of children. Store between 20 and 25 degrees C (68 and 77 degrees F). Do not store at temperatures greater than 40 degrees C (104 degrees F) and do not take this medicine if you think it may have been stored at a temperature greater than 104 degrees F. Throw away any unused medicine after the expiration date. NOTE: This sheet is a summary. It may not cover all possible information. If you have questions about this medicine, talk to your doctor, pharmacist, or health care provider.  2018 Elsevier/Gold Standard (2016-08-21 12:45:04)  

## 2017-12-09 NOTE — Telephone Encounter (Signed)
Gave patient AVS and calendar of upcoming May appointments.  °

## 2017-12-10 DIAGNOSIS — Z1509 Genetic susceptibility to other malignant neoplasm: Secondary | ICD-10-CM

## 2017-12-10 DIAGNOSIS — Z1501 Genetic susceptibility to malignant neoplasm of breast: Secondary | ICD-10-CM | POA: Insufficient documentation

## 2017-12-10 DIAGNOSIS — Z8543 Personal history of malignant neoplasm of ovary: Secondary | ICD-10-CM | POA: Insufficient documentation

## 2017-12-10 NOTE — Assessment & Plan Note (Signed)
She has remote history of ovarian cancer I will order tumor marker

## 2017-12-10 NOTE — Assessment & Plan Note (Signed)
She is known to have chronic lymphedema The area of abnormally on exam on the left chest wall is out of proportion I will order CT scan of the chest to exclude cancer recurrence and she agreed to proceed

## 2017-12-10 NOTE — Assessment & Plan Note (Signed)
She has poorly controlled diabetes without complication We discussed extensively about dietary modification and weight loss strategy

## 2017-12-10 NOTE — Assessment & Plan Note (Signed)
The patient tested positive for BRCA mutation We discussed the use of PARP inhibitor in the future She appears interested I will discuss with her further next week

## 2017-12-10 NOTE — Assessment & Plan Note (Signed)
I am concerned about probable chest wall nodules on both sides of her chest wall, worse on the left compared to the right from our previous visit I recommend CT scan of the chest to exclude cancer recurrence She agreed to proceed

## 2017-12-10 NOTE — Progress Notes (Signed)
[facility] OFFICE PROGRESS NOTE  Patient Care Team: Glendon Axe, MD as PCP - General (Family Medicine) Donnie Mesa, MD as Consulting Physician (General Surgery)  ASSESSMENT & PLAN:  History of malignant neoplasm of right breast I am concerned about probable chest wall nodules on both sides of her chest wall, worse on the left compared to the right from our previous visit I recommend CT scan of the chest to exclude cancer recurrence She agreed to proceed  Lymphedema of breast She is known to have chronic lymphedema The area of abnormally on exam on the left chest wall is out of proportion I will order CT scan of the chest to exclude cancer recurrence and she agreed to proceed  Diabetes mellitus without complication, without long-term current use of insulin ([place]) She has poorly controlled diabetes without complication We discussed extensively about dietary modification and weight loss strategy  BRCA gene mutation positive The patient tested positive for BRCA mutation We discussed the use of PARP inhibitor in the future She appears interested I will discuss with her further next week   History of ovarian cancer She has remote history of ovarian cancer I will order tumor marker   Orders Placed This Encounter  Procedures  . CT CHEST W CONTRAST    Standing Status:   Future    Standing Expiration Date:   12/10/2018    Order Specific Question:   If indicated for the ordered procedure, I authorize the administration of contrast media per Radiology protocol    Answer:   Yes    Order Specific Question:   Preferred imaging location?    Answer:   [facility]    Order Specific Question:   Radiology Contrast Protocol - do NOT remove file path    Answer:   [email or web address]  . CBC with Differential/Platelet    Standing Status:   Future    Standing Expiration Date:   01/13/2019  . Comprehensive metabolic panel    Standing Status:    Future    Standing Expiration Date:   01/13/2019  . Cancer antigen 27.29    Standing Status:   Future    Standing Expiration Date:   01/13/2019  . CA 125    Standing Status:   Future    Standing Expiration Date:   01/14/2019    INTERVAL HISTORY: Please see below for problem oriented charting. She returns for further follow-up I have not seen her for 2 years She was evaluated by general surgery 2 years ago for palpable chest wall nodule She was told it was likely lymphedema She has noted a large area lymphedema/chest wall mass on the left side She denies cough, chest pain or shortness of breath She denies recent changes in bowel habits or abdominal bloating No recent vaginal bleeding  SUMMARY OF ONCOLOGIC HISTORY: Oncology History   Breast cancer, BRCA2 positive   Primary site: Breast (Right)   Staging method: AJCC 7th Edition   Clinical: Stage IIA (T2, N0, cM0) signed by Heath Lark, MD on 10/30/2013 11:47 AM   Pathologic: Stage IIA (T2, N0, cM0) signed by Heath Lark, MD on 10/30/2013 11:47 AM   Summary: Stage IIA (T2, N0, cM0)       History of malignant neoplasm of right breast   10/16/2005 Imaging    Screening mammogram came back abnormal with right breast mass      11/07/2005 Imaging    Diagnostic mammogram and ultrasound confirmed right breast abnormalities.  11/12/2005 Procedure    Ultrasound biopsy confirmed invasive ductal carcinoma, poorly differentiated, ER, PR, HER-2/neu negative      11/20/2005 - 05/23/2006 Chemotherapy    The patient completed 6 cycles of TAC adjuvant treatment.      12/04/2005 Surgery    She underwent right lumpectomy and sentinel lymph node biopsy.      06/06/2006 - 08/02/2006 Radiation Therapy    The patient completed adjuvant radiation therapy.      10/23/2006 Imaging    Repeat mammogram show no evidence of malignancy.      02/14/2007 Surgery    The patient underwent unilateral salpingo-oophorectomy on the right side which was negative  for malignancy.      10/29/2007 Imaging    The patient had repeat ultrasound which confirms aroma. Aspiration was done.      03/03/2010 Surgery    She underwent prophylactic left mastectomy and right mastectomy for preventive strategy.       REVIEW OF SYSTEMS:   Constitutional: Denies fevers, chills or abnormal weight loss Eyes: Denies blurriness of vision Ears, nose, mouth, throat, and face: Denies mucositis or sore throat Respiratory: Denies cough, dyspnea or wheezes Cardiovascular: Denies palpitation, chest discomfort or lower extremity swelling Gastrointestinal:  Denies nausea, heartburn or change in bowel habits Skin: Denies abnormal skin rashes Lymphatics: Denies new lymphadenopathy or easy bruising Neurological:Denies numbness, tingling or new weaknesses Behavioral/Psych: Mood is stable, no new changes  All other systems were reviewed with the patient and are negative.  I have reviewed the past medical history, past surgical history, social history and family history with the patient and they are unchanged from previous note.  ALLERGIES:  is allergic to sulfa antibiotics.  MEDICATIONS:  Current Outpatient Medications  Medication Sig Dispense Refill  . aspirin EC 81 MG tablet Take 81 mg by mouth daily.    Marland Kitchen atorvastatin (LIPITOR) 20 MG tablet Take 20 mg by mouth daily.    . magnesium oxide (MAG-OX) 400 MG tablet Take 400 mg by mouth daily.    . calcium citrate-vitamin D (CITRACAL+D) 315-200 MG-UNIT per tablet Take 1 tablet by mouth 2 (two) times daily.    Marland Kitchen lisinopril-hydrochlorothiazide (PRINZIDE,ZESTORETIC) 20-12.5 MG per tablet Take 1 tablet by mouth daily. 30 tablet 2  . metFORMIN (GLUCOPHAGE) 500 MG tablet Take 500 mg by mouth daily.      No current facility-administered medications for this visit.     PHYSICAL EXAMINATION: ECOG PERFORMANCE STATUS: 1 - Symptomatic but completely ambulatory  Vitals:   12/09/17 1543  BP: (!) 154/81  Pulse: 73  Resp: 18  Temp:  98.3 F (36.8 C)  SpO2: 100%   Filed Weights   12/09/17 1543  Weight: 238 lb 1.6 oz (108 kg)    GENERAL:alert, no distress and comfortable SKIN: skin color, texture, turgor are normal, no rashes or significant lesions EYES: normal, Conjunctiva are pink and non-injected, sclera clear OROPHARYNX:no exudate, no erythema and lips, buccal mucosa, and tongue normal  NECK: supple, thyroid normal size, non-tender, without nodularity LYMPH:  no palpable lymphadenopathy in the cervical, axillary or inguinal LUNGS: clear to auscultation and percussion with normal breathing effort HEART: regular rate & rhythm and no murmurs and no lower extremity edema ABDOMEN:abdomen soft, non-tender and normal bowel sounds Musculoskeletal:no cyanosis of digits and no clubbing  NEURO: alert & oriented x 3 with fluent speech, no focal motor/sensory deficits Chest wall examination revealed well-healed bilateral mastectomy scars.  There are palpable skin nodularities along the mastectomy scars with a large  area of palpable soft tissue mass over the left chest wall mass of indeterminate etiology  LABORATORY DATA:  I have reviewed the data as listed    Component Value Date/Time   NA 137 03/18/2012 1448   K 3.0 (L) 03/18/2012 1448   CL 100 03/18/2012 1448   CO2 29 03/18/2012 1448   GLUCOSE 121 (H) 03/18/2012 1448   BUN 12 03/18/2012 1448   CREATININE 0.64 03/18/2012 1448   CALCIUM 9.9 03/18/2012 1448   PROT 7.4 03/18/2012 1448   ALBUMIN 3.6 03/18/2012 1448   AST 11 03/18/2012 1448   ALT 10 03/18/2012 1448   ALKPHOS 59 03/18/2012 1448   BILITOT 0.2 (L) 03/18/2012 1448   GFRNONAA >60 03/03/2010 0754   GFRAA  03/03/2010 0754    >60        The eGFR has been calculated using the MDRD equation. This calculation has not been validated in all clinical situations. eGFR's persistently <60 mL/min signify possible Chronic Kidney Disease.    No results found for: SPEP, UPEP  Lab Results  Component Value  Date   WBC 11.1 (H) 03/18/2012   NEUTROABS 6.4 03/18/2012   HGB 11.9 03/18/2012   HCT 37.6 03/18/2012   MCV 77.3 (L) 03/18/2012   PLT 303 03/18/2012      Chemistry      Component Value Date/Time   NA 137 03/18/2012 1448   K 3.0 (L) 03/18/2012 1448   CL 100 03/18/2012 1448   CO2 29 03/18/2012 1448   BUN 12 03/18/2012 1448   CREATININE 0.64 03/18/2012 1448      Component Value Date/Time   CALCIUM 9.9 03/18/2012 1448   ALKPHOS 59 03/18/2012 1448   AST 11 03/18/2012 1448   ALT 10 03/18/2012 1448   BILITOT 0.2 (L) 03/18/2012 1448       All questions were answered. The patient knows to call the clinic with any problems, questions or concerns. No barriers to learning was detected.  I spent 25 minutes counseling the patient face to face. The total time spent in the appointment was 40 minutes and more than 50% was on counseling and review of test results  Heath Lark, MD 12/10/2017 12:15 PM

## 2017-12-16 ENCOUNTER — Inpatient Hospital Stay: Payer: Medicare HMO

## 2017-12-17 ENCOUNTER — Ambulatory Visit (HOSPITAL_COMMUNITY): Payer: Medicare HMO

## 2017-12-17 ENCOUNTER — Inpatient Hospital Stay: Payer: Medicare HMO | Admitting: Hematology and Oncology

## 2017-12-18 ENCOUNTER — Inpatient Hospital Stay: Payer: Medicare HMO

## 2017-12-18 ENCOUNTER — Ambulatory Visit (HOSPITAL_COMMUNITY)
Admission: RE | Admit: 2017-12-18 | Discharge: 2017-12-18 | Disposition: A | Discharge status: Home / Self Care | Payer: Medicare HMO | Source: Ambulatory Visit | Attending: Hematology and Oncology | Admitting: Hematology and Oncology

## 2017-12-18 ENCOUNTER — Encounter (HOSPITAL_COMMUNITY): Payer: Self-pay

## 2017-12-18 DIAGNOSIS — Z853 Personal history of malignant neoplasm of breast: Secondary | ICD-10-CM

## 2017-12-18 DIAGNOSIS — Z8543 Personal history of malignant neoplasm of ovary: Secondary | ICD-10-CM

## 2017-12-18 DIAGNOSIS — I700 Atherosclerosis of aorta: Secondary | ICD-10-CM | POA: Diagnosis not present

## 2017-12-18 DIAGNOSIS — I890 Lymphedema, not elsewhere classified: Secondary | ICD-10-CM

## 2017-12-18 LAB — COMPREHENSIVE METABOLIC PANEL
ALT: 12 U/L (ref 0–55)
AST: 12 U/L (ref 5–34)
Albumin: 4 g/dL (ref 3.5–5.0)
Alkaline Phosphatase: 66 U/L (ref 40–150)
Anion gap: 9 (ref 3–11)
BILIRUBIN TOTAL: 0.4 mg/dL (ref 0.2–1.2)
BUN: 9 mg/dL (ref 7–26)
CHLORIDE: 102 mmol/L (ref 98–109)
CO2: 29 mmol/L (ref 22–29)
CREATININE: 0.73 mg/dL (ref 0.60–1.10)
Calcium: 10.2 mg/dL (ref 8.4–10.4)
GFR calc Af Amer: >60 mL/min (ref >60)
Glucose, Bld: 97 mg/dL (ref 70–140)
Potassium: 3.4 mmol/L — ABNORMAL LOW (ref 3.5–5.1)
Sodium: 140 mmol/L (ref 136–145)
Total Protein: 7.8 g/dL (ref 6.4–8.3)

## 2017-12-18 LAB — CBC WITH DIFFERENTIAL/PLATELET
BASOS ABS: 0.1 10*3/uL (ref 0.0–0.1)
Basophils Relative: 1 %
Eosinophils Absolute: 0.3 10*3/uL (ref 0.0–0.5)
Eosinophils Relative: 3 %
HEMATOCRIT: 36.7 % (ref 34.8–46.6)
Hemoglobin: 12 g/dL (ref 11.6–15.9)
LYMPHS PCT: 32 %
Lymphs Abs: 3.4 10*3/uL — ABNORMAL HIGH (ref 0.9–3.3)
MCH: 25.3 pg (ref 25.1–34.0)
MCHC: 32.6 g/dL (ref 31.5–36.0)
MCV: 77.5 fL — AB (ref 79.5–101.0)
Monocytes Absolute: 0.8 10*3/uL (ref 0.1–0.9)
Monocytes Relative: 8 %
NEUTROS ABS: 6 10*3/uL (ref 1.5–6.5)
Neutrophils Relative %: 56 %
Platelets: 310 10*3/uL (ref 145–400)
RBC: 4.74 MIL/uL (ref 3.70–5.45)
RDW: 14.9 % — ABNORMAL HIGH (ref 11.2–14.5)
WBC: 10.7 10*3/uL — ABNORMAL HIGH (ref 3.9–10.3)

## 2017-12-18 MED ORDER — IOHEXOL 300 MG/ML  SOLN
75.0000 mL | Freq: Once | INTRAMUSCULAR | Status: AC | PRN
  Administered 2017-12-18: 75 mL via INTRAVENOUS

## 2017-12-19 ENCOUNTER — Encounter: Payer: Self-pay | Admitting: Hematology and Oncology

## 2017-12-19 ENCOUNTER — Telehealth: Payer: Self-pay | Admitting: Hematology and Oncology

## 2017-12-19 ENCOUNTER — Inpatient Hospital Stay (HOSPITAL_BASED_OUTPATIENT_CLINIC_OR_DEPARTMENT_OTHER): Payer: Medicare HMO | Admitting: Hematology and Oncology

## 2017-12-19 DIAGNOSIS — Z1509 Genetic susceptibility to other malignant neoplasm: Secondary | ICD-10-CM

## 2017-12-19 DIAGNOSIS — Z1501 Genetic susceptibility to malignant neoplasm of breast: Secondary | ICD-10-CM

## 2017-12-19 DIAGNOSIS — E119 Type 2 diabetes mellitus without complications: Secondary | ICD-10-CM | POA: Diagnosis not present

## 2017-12-19 DIAGNOSIS — Z8543 Personal history of malignant neoplasm of ovary: Secondary | ICD-10-CM | POA: Diagnosis not present

## 2017-12-19 DIAGNOSIS — Z853 Personal history of malignant neoplasm of breast: Secondary | ICD-10-CM

## 2017-12-19 LAB — CA 125: CANCER ANTIGEN (CA) 125: 10.1 U/mL (ref 0.0–38.1)

## 2017-12-19 LAB — CANCER ANTIGEN 27.29: CA 27.29: 16 U/mL (ref 0.0–38.6)

## 2017-12-19 NOTE — Progress Notes (Signed)
[name] OFF[facility] PROGRESS NOTE  Patient Care Team: Glendon Axe, MD as PCP - General (Family Medicine) Donnie Mesa, MD as Consulting Physician (General Surgery)  ASSESSMENT & PLAN:  History of malignant neoplasm of right breast CT imaging confirmed the probable chest wall masses are due to lymphedema We discussed the risk and benefit of general surgery reduce referral for management but the patient declined  I plan to see her again next year for history and physical examination only   History of ovarian cancer Tumor marker level is normal We will continue close observation  BRCA gene mutation positive We discussed the risk and benefit of PARP inhibitor She has been cancer free for many years For now, we will not start her on PARP inhibitor  Diabetes mellitus without complication, without long-term current use of insulin ([place]) She has diabetes without complication We discussed extensively about dietary modification and weight loss strategy   No orders of the defined types were placed in this encounter.   INTERVAL HISTORY: Please see below for problem oriented charting. She returns with her sister, Debbie [name] for further follow-up She feels well She had no new symptoms since the last time we met  SUMMARY OF ONCOLOGIC HISTORY: Oncology History   Breast cancer, BRCA2 positive   Primary site: Breast (Right)   Staging method: AJCC 7th Edition   Clinical: Stage IIA (T2, N0, cM0) signed by Heath Lark, MD on 10/30/2013 11:47 AM   Pathologic: Stage IIA (T2, N0, cM0) signed by Heath Lark, MD on 10/30/2013 11:47 AM   Summary: Stage IIA (T2, N0, cM0)       History of malignant neoplasm of right breast   10/16/2005 Imaging    Screening mammogram came back abnormal with right breast mass      11/07/2005 Imaging    Diagnostic mammogram and ultrasound confirmed right breast abnormalities.      11/12/2005 Procedure    Ultrasound biopsy confirmed invasive ductal  carcinoma, poorly differentiated, ER, PR, HER-2/neu negative      11/20/2005 - 05/23/2006 Chemotherapy    The patient completed 6 cycles of TAC adjuvant treatment.      12/04/2005 Surgery    She underwent right lumpectomy and sentinel lymph node biopsy.      06/06/2006 - 08/02/2006 Radiation Therapy    The patient completed adjuvant radiation therapy.      10/23/2006 Imaging    Repeat mammogram show no evidence of malignancy.      02/14/2007 Surgery    The patient underwent unilateral salpingo-oophorectomy on the right side which was negative for malignancy.      10/29/2007 Imaging    The patient had repeat ultrasound which confirms aroma. Aspiration was done.      03/03/2010 Surgery    She underwent prophylactic left mastectomy and right mastectomy for preventive strategy.      10/02/2017 Imaging    Outside CT chest was negative      12/19/2017 Imaging    No acute findings. No evidence of recurrent or metastatic carcinoma within the thorax.  Aortic Atherosclerosis (ICD10-I70.0).       REVIEW OF SYSTEMS:   Constitutional: Denies fevers, chills or abnormal weight loss Eyes: Denies blurriness of vision Ears, nose, mouth, throat, and face: Denies mucositis or sore throat Respiratory: Denies cough, dyspnea or wheezes Cardiovascular: Denies palpitation, chest discomfort or lower extremity swelling Gastrointestinal:  Denies nausea, heartburn or change in bowel habits Skin: Denies abnormal skin rashes Lymphatics: Denies new lymphadenopathy or easy bruising  Neurological:Denies numbness, tingling or new weaknesses Behavioral/Psych: Mood is stable, no new changes  All other systems were reviewed with the patient and are negative.  I have reviewed the past medical history, past surgical history, social history and family history with the patient and they are unchanged from previous note.  ALLERGIES:  is allergic to sulfa antibiotics.  MEDICATIONS:  Current Outpatient  Medications  Medication Sig Dispense Refill  . aspirin EC 81 MG tablet Take 81 mg by mouth daily.    Marland Kitchen atorvastatin (LIPITOR) 20 MG tablet Take 20 mg by mouth daily.    . calcium citrate-vitamin D (CITRACAL+D) 315-200 MG-UNIT per tablet Take 1 tablet by mouth 2 (two) times daily.    Marland Kitchen lisinopril-hydrochlorothiazide (PRINZIDE,ZESTORETIC) 20-12.5 MG per tablet Take 1 tablet by mouth daily. 30 tablet 2  . magnesium oxide (MAG-OX) 400 MG tablet Take 400 mg by mouth daily.    . metFORMIN (GLUCOPHAGE) 500 MG tablet Take 500 mg by mouth daily.      No current facility-administered medications for this visit.     PHYSICAL EXAMINATION: ECOG PERFORMANCE STATUS: 0 - Asymptomatic  Vitals:   12/19/17 1150  BP: (!) 177/62  Pulse: 66  Resp: 18  Temp: 98.1 F (36.7 C)  SpO2: 100%   Filed Weights   12/19/17 1150  Weight: 237 lb 1.6 oz (107.5 kg)    GENERAL:alert, no distress and comfortable NEURO: alert & oriented x 3 with fluent speech, no focal motor/sensory deficits  LABORATORY DATA:  I have reviewed the data as listed    Component Value Date/Time   NA 140 12/18/2017 1358   K 3.4 (L) 12/18/2017 1358   CL 102 12/18/2017 1358   CO2 29 12/18/2017 1358   GLUCOSE 97 12/18/2017 1358   BUN 9 12/18/2017 1358   CREATININE 0.73 12/18/2017 1358   CALCIUM 10.2 12/18/2017 1358   PROT 7.8 12/18/2017 1358   ALBUMIN 4.0 12/18/2017 1358   AST 12 12/18/2017 1358   ALT 12 12/18/2017 1358   ALKPHOS 66 12/18/2017 1358   BILITOT 0.4 12/18/2017 1358   GFRNONAA >60 12/18/2017 1358   GFRAA >60 12/18/2017 1358    No results found for: SPEP, UPEP  Lab Results  Component Value Date   WBC 10.7 (H) 12/18/2017   NEUTROABS 6.0 12/18/2017   HGB 12.0 12/18/2017   HCT 36.7 12/18/2017   MCV 77.5 (L) 12/18/2017   PLT 310 12/18/2017      Chemistry      Component Value Date/Time   NA 140 12/18/2017 1358   K 3.4 (L) 12/18/2017 1358   CL 102 12/18/2017 1358   CO2 29 12/18/2017 1358   BUN 9  12/18/2017 1358   CREATININE 0.73 12/18/2017 1358      Component Value Date/Time   CALCIUM 10.2 12/18/2017 1358   ALKPHOS 66 12/18/2017 1358   AST 12 12/18/2017 1358   ALT 12 12/18/2017 1358   BILITOT 0.4 12/18/2017 1358       RADIOGRAPHIC STUDIES: I have reviewed the imaging study with the patient and sister I have personally reviewed the radiological images as listed and agreed with the findings in the report. Ct Chest W Contrast  Result Date: 12/19/2017 CLINICAL DATA:  Follow-up right breast carcinoma.  Lymphedema. EXAM: CT CHEST WITH CONTRAST TECHNIQUE: Multidetector CT imaging of the chest was performed during intravenous contrast administration. CONTRAST:  34m OMNIPAQUE IOHEXOL 300 MG/ML  SOLN COMPARISON:  09/25/2011 FINDINGS: Cardiovascular:  No acute findings. Aortic atherosclerosis. Mediastinum/Nodes: No masses  or pathologically enlarged lymph nodes identified. Previous bilateral mastectomies with left breast implant. No evidence of axillary lymphadenopathy. Lungs/Pleura: 4 mm pulmonary nodule in right middle lobe remains stable, consistent with benign etiology. No new or enlarging pulmonary nodules or masses identified. Mild scarring noted bilaterally, however there is no evidence of pulmonary infiltrate or pleural effusion. Upper Abdomen:  Unremarkable. Musculoskeletal:  No suspicious bone lesions. IMPRESSION: No acute findings. No evidence of recurrent or metastatic carcinoma within the thorax. Aortic Atherosclerosis (ICD10-I70.0). Electronically Signed   By: Earle Gell M.D.   On: 12/19/2017 10:26    All questions were answered. The patient knows to call the clinic with any problems, questions or concerns. No barriers to learning was detected.  I spent 15 minutes counseling the patient face to face. The total time spent in the appointment was 20 minutes and more than 50% was on counseling and review of test results  Heath Lark, MD 12/19/2017 12:45 PM

## 2017-12-19 NOTE — Assessment & Plan Note (Signed)
We discussed the risk and benefit of PARP inhibitor She has been cancer free for many years For now, we will not start her on PARP inhibitor

## 2017-12-19 NOTE — Assessment & Plan Note (Addendum)
She has diabetes without complication We discussed extensively about dietary modification and weight loss strategy

## 2017-12-19 NOTE — Assessment & Plan Note (Signed)
CT imaging confirmed the probable chest wall masses are due to lymphedema We discussed the risk and benefit of general surgery reduce referral for management but the patient declined  I plan to see her again next year for history and physical examination only

## 2017-12-19 NOTE — Telephone Encounter (Signed)
Gave patient AVs and calendar of upcoming may 2020 appointments °

## 2017-12-19 NOTE — Assessment & Plan Note (Signed)
Tumor marker level is normal We will continue close observation

## 2018-12-18 ENCOUNTER — Inpatient Hospital Stay: Payer: Medicare HMO | Attending: Hematology and Oncology | Admitting: Hematology and Oncology

## 2018-12-18 ENCOUNTER — Other Ambulatory Visit: Payer: Self-pay

## 2018-12-18 DIAGNOSIS — Z853 Personal history of malignant neoplasm of breast: Secondary | ICD-10-CM | POA: Diagnosis not present

## 2018-12-18 DIAGNOSIS — Z79899 Other long term (current) drug therapy: Secondary | ICD-10-CM | POA: Insufficient documentation

## 2018-12-18 DIAGNOSIS — Z1501 Genetic susceptibility to malignant neoplasm of breast: Secondary | ICD-10-CM | POA: Insufficient documentation

## 2018-12-18 DIAGNOSIS — Z8543 Personal history of malignant neoplasm of ovary: Secondary | ICD-10-CM | POA: Diagnosis not present

## 2018-12-19 ENCOUNTER — Encounter: Payer: Self-pay | Admitting: Hematology and Oncology

## 2018-12-19 NOTE — Assessment & Plan Note (Signed)
She has no change on exam The hard palpable mass on the left breast is due to chronic lymphedema She was referred to general surgery for evaluation and further biopsy of follow-up was not necessary We discussed long-term follow-up The patient is comfortable to follow with primary care doctor only.  She is aware that I will be her available to see her back if needed I recommend she report any changes to her chest wall exam to her primary care doctor and I will be happy to see her back if needed

## 2018-12-19 NOTE — Progress Notes (Signed)
[facility] OFFICE PROGRESS NOTE  Patient Care Team: Glendon Axe, MD as PCP - General (Family Medicine) Donnie Mesa, MD as Consulting Physician (General Surgery)  ASSESSMENT & PLAN:  History of malignant neoplasm of right breast She has no change on exam The hard palpable mass on the left breast is due to chronic lymphedema She was referred to general surgery for evaluation and further biopsy of follow-up was not necessary We discussed long-term follow-up The patient is comfortable to follow with primary care doctor only.  She is aware that I will be her available to see her back if needed I recommend she report any changes to her chest wall exam to her primary care doctor and I will be happy to see her back if needed  Morbid obesity We have long discussion about the importance of weight loss, healthy living, and tight control of diabetes and blood pressure for longevity She will continue close follow-up with her primary care doctor     No orders of the defined types were placed in this encounter.   INTERVAL HISTORY: Please see below for problem oriented charting. She returns for further follow-up Since last time I saw her, she feels well She denies changes to her chest wall.  The palpable lump on the left chest wall is stable She denies abdominal bloating or abnormal vaginal discharge  SUMMARY OF ONCOLOGIC HISTORY: Oncology History   Breast cancer, BRCA2 positive   Primary site: Breast (Right)   Staging method: AJCC 7th Edition   Clinical: Stage IIA (T2, N0, cM0) signed by Heath Lark, MD on 10/30/2013 11:47 AM   Pathologic: Stage IIA (T2, N0, cM0) signed by Heath Lark, MD on 10/30/2013 11:47 AM   Summary: Stage IIA (T2, N0, cM0)       History of malignant neoplasm of right breast   10/16/2005 Imaging    Screening mammogram came back abnormal with right breast mass    11/07/2005 Imaging    Diagnostic mammogram and ultrasound confirmed right breast  abnormalities.    11/12/2005 Procedure    Ultrasound biopsy confirmed invasive ductal carcinoma, poorly differentiated, ER, PR, HER-2/neu negative    11/20/2005 - 05/23/2006 Chemotherapy    The patient completed 6 cycles of TAC adjuvant treatment.    12/04/2005 Surgery    She underwent right lumpectomy and sentinel lymph node biopsy.    06/06/2006 - 08/02/2006 Radiation Therapy    The patient completed adjuvant radiation therapy.    10/23/2006 Imaging    Repeat mammogram show no evidence of malignancy.    02/14/2007 Surgery    The patient underwent unilateral salpingo-oophorectomy on the right side which was negative for malignancy.    10/29/2007 Imaging    The patient had repeat ultrasound which confirms aroma. Aspiration was done.    03/03/2010 Surgery    She underwent prophylactic left mastectomy and right mastectomy for preventive strategy.    10/02/2017 Imaging    Outside CT chest was negative    12/19/2017 Imaging    No acute findings. No evidence of recurrent or metastatic carcinoma within the thorax.  Aortic Atherosclerosis (ICD10-I70.0).     REVIEW OF SYSTEMS:   Constitutional: Denies fevers, chills or abnormal weight loss Eyes: Denies blurriness of vision Ears, nose, mouth, throat, and face: Denies mucositis or sore throat Respiratory: Denies cough, dyspnea or wheezes Cardiovascular: Denies palpitation, chest discomfort or lower extremity swelling Gastrointestinal:  Denies nausea, heartburn or change in bowel habits Skin: Denies abnormal skin rashes Lymphatics: Denies new  lymphadenopathy or easy bruising Neurological:Denies numbness, tingling or new weaknesses Behavioral/Psych: Mood is stable, no new changes  All other systems were reviewed with the patient and are negative.  I have reviewed the past medical history, past surgical history, social history and family history with the patient and they are unchanged from previous note.  ALLERGIES:  is allergic to sulfa  antibiotics.  MEDICATIONS:  Current Outpatient Medications  Medication Sig Dispense Refill  . aspirin EC 81 MG tablet Take 81 mg by mouth daily.    Marland Kitchen atorvastatin (LIPITOR) 20 MG tablet Take 20 mg by mouth daily.    . calcium citrate-vitamin D (CITRACAL+D) 315-200 MG-UNIT per tablet Take 1 tablet by mouth 2 (two) times daily.    Marland Kitchen lisinopril-hydrochlorothiazide (PRINZIDE,ZESTORETIC) 20-12.5 MG per tablet Take 1 tablet by mouth daily. 30 tablet 2  . magnesium oxide (MAG-OX) 400 MG tablet Take 400 mg by mouth daily.    . metFORMIN (GLUCOPHAGE) 500 MG tablet Take 500 mg by mouth daily.      No current facility-administered medications for this visit.     PHYSICAL EXAMINATION: ECOG PERFORMANCE STATUS: 0 - Asymptomatic  Vitals:   12/18/18 0936  BP: (!) 144/71  Pulse: 100  Resp: (!) 98  SpO2: (!) 18%   Filed Weights   12/18/18 0936  Weight: 237 lb 12.8 oz (107.9 kg)    GENERAL:alert, no distress and comfortable SKIN: skin color, texture, turgor are normal, no rashes or significant lesions EYES: normal, Conjunctiva are pink and non-injected, sclera clear OROPHARYNX:no exudate, no erythema and lips, buccal mucosa, and tongue normal  NECK: supple, thyroid normal size, non-tender, without nodularity LYMPH:  no palpable lymphadenopathy in the cervical, axillary or inguinal LUNGS: clear to auscultation and percussion with normal breathing effort HEART: regular rate & rhythm and no murmurs and no lower extremity edema ABDOMEN:abdomen soft, non-tender and normal bowel sounds Musculoskeletal:no cyanosis of digits and no clubbing  NEURO: alert & oriented x 3 with fluent speech, no focal motor/sensory deficits Chest wall examination revealed well-healed bilateral mastectomy scar The heart lump palpable on her left chest wall is stable, most consistent with chronic lymphedema  LABORATORY DATA:  I have reviewed the data as listed    Component Value Date/Time   NA 140 12/18/2017 1358   K  3.4 (L) 12/18/2017 1358   CL 102 12/18/2017 1358   CO2 29 12/18/2017 1358   GLUCOSE 97 12/18/2017 1358   BUN 9 12/18/2017 1358   CREATININE 0.73 12/18/2017 1358   CALCIUM 10.2 12/18/2017 1358   PROT 7.8 12/18/2017 1358   ALBUMIN 4.0 12/18/2017 1358   AST 12 12/18/2017 1358   ALT 12 12/18/2017 1358   ALKPHOS 66 12/18/2017 1358   BILITOT 0.4 12/18/2017 1358   GFRNONAA >60 12/18/2017 1358   GFRAA >60 12/18/2017 1358    No results found for: SPEP, UPEP  Lab Results  Component Value Date   WBC 10.7 (H) 12/18/2017   NEUTROABS 6.0 12/18/2017   HGB 12.0 12/18/2017   HCT 36.7 12/18/2017   MCV 77.5 (L) 12/18/2017   PLT 310 12/18/2017      Chemistry      Component Value Date/Time   NA 140 12/18/2017 1358   K 3.4 (L) 12/18/2017 1358   CL 102 12/18/2017 1358   CO2 29 12/18/2017 1358   BUN 9 12/18/2017 1358   CREATININE 0.73 12/18/2017 1358      Component Value Date/Time   CALCIUM 10.2 12/18/2017 1358   ALKPHOS 66  12/18/2017 1358   AST 12 12/18/2017 1358   ALT 12 12/18/2017 1358   BILITOT 0.4 12/18/2017 1358      All questions were answered. The patient knows to call the clinic with any problems, questions or concerns. No barriers to learning was detected.  I spent 10 minutes counseling the patient face to face. The total time spent in the appointment was 15 minutes and more than 50% was on counseling and review of test results  Heath Lark, MD 12/19/2018 11:15 AM

## 2018-12-19 NOTE — Assessment & Plan Note (Signed)
We have long discussion about the importance of weight loss, healthy living, and tight control of diabetes and blood pressure for longevity She will continue close follow-up with her primary care doctor

## 2019-05-26 ENCOUNTER — Encounter: Payer: Self-pay | Admitting: Podiatry

## 2019-05-26 ENCOUNTER — Other Ambulatory Visit: Payer: Self-pay

## 2019-05-26 ENCOUNTER — Ambulatory Visit (INDEPENDENT_AMBULATORY_CARE_PROVIDER_SITE_OTHER): Payer: Medicare Other | Admitting: Podiatry

## 2019-05-26 DIAGNOSIS — E1142 Type 2 diabetes mellitus with diabetic polyneuropathy: Secondary | ICD-10-CM | POA: Diagnosis not present

## 2019-05-26 DIAGNOSIS — M79675 Pain in left toe(s): Secondary | ICD-10-CM | POA: Diagnosis not present

## 2019-05-26 DIAGNOSIS — M79674 Pain in right toe(s): Secondary | ICD-10-CM

## 2019-05-26 DIAGNOSIS — E1140 Type 2 diabetes mellitus with diabetic neuropathy, unspecified: Secondary | ICD-10-CM | POA: Insufficient documentation

## 2019-05-26 DIAGNOSIS — B351 Tinea unguium: Secondary | ICD-10-CM | POA: Diagnosis not present

## 2019-05-26 NOTE — Progress Notes (Signed)
This patient presents to the office with chief complaint of long thick nails and diabetic feet.  This patient  says there  is  no pain and discomfort in her  feet.  This patient says there are long thick painful nails.  These nails are painful walking and wearing shoes.  Patient has no history of infection or drainage from both feet.  Patient is unable to  self treat her own nails . This patient presents  to the office today for treatment of the  long nails and a foot evaluation due to history of  diabetes.  General Appearance  Alert, conversant and in no acute stress.  Vascular  Dorsalis pedis and posterior tibial  pulses are weakly  palpable  bilaterally.  Capillary return is within normal limits  bilaterally. Temperature is within normal limits  bilaterally.  Neurologic  Senn-Weinstein monofilament wire test dinimished   bilaterally. Muscle power within normal limits bilaterally.  Nails Thick disfigured discolored nails with subungual debris  from hallux to fifth toes bilaterally. No evidence of bacterial infection or drainage bilaterally.  Orthopedic  No limitations of motion of motion feet .  No crepitus or effusions noted.  No bony pathology or digital deformities noted. HAV  B/L.  Skin  normotropic skin Porokeratosis sub 3 left foot.  No signs of infections or ulcers noted.     Onychomycosis   Porokeratosis sub 3 left foot. Diabetes with no foot complications  IE  Debride nails x 10.  Debride porokeratosis  Left foot.  A diabetic foot exam was performed and there is  evidence of  vascular or neurologic pathology.   RTC 3 months. Patient qualifies for diabetic shoes but she must be seen by her PCP prior to being measured for diabetic shoes.  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  RTC 3 months.   Gardiner Barefoot DPM

## 2019-07-20 ENCOUNTER — Telehealth: Payer: Self-pay | Admitting: Podiatry

## 2019-07-20 NOTE — Telephone Encounter (Signed)
Pt left message stating she has seen her md and was told by Dr Prudence Davidson she qualifies for diabetic shoes and she is now interested in getting them.   I returned call and voicemail is full.

## 2019-08-26 ENCOUNTER — Encounter: Payer: Self-pay | Admitting: Podiatry

## 2019-08-26 ENCOUNTER — Other Ambulatory Visit: Payer: Self-pay

## 2019-08-26 ENCOUNTER — Ambulatory Visit (INDEPENDENT_AMBULATORY_CARE_PROVIDER_SITE_OTHER): Payer: Medicare Other | Admitting: Podiatry

## 2019-08-26 VITALS — Temp 97.0°F

## 2019-08-26 DIAGNOSIS — E1142 Type 2 diabetes mellitus with diabetic polyneuropathy: Secondary | ICD-10-CM | POA: Diagnosis not present

## 2019-08-26 DIAGNOSIS — M79674 Pain in right toe(s): Secondary | ICD-10-CM

## 2019-08-26 DIAGNOSIS — B351 Tinea unguium: Secondary | ICD-10-CM

## 2019-08-26 DIAGNOSIS — M79675 Pain in left toe(s): Secondary | ICD-10-CM

## 2019-08-26 NOTE — Progress Notes (Signed)
Complaint:  Visit Type: Patient returns to my office for continued preventative foot care services. Complaint: Patient states" my nails have grown long and thick and become painful to walk and wear shoes" Patient has been diagnosed with DM with neuropathy.. The patient presents for preventative foot care services.  Podiatric Exam: Vascular: dorsalis pedis and posterior tibial pulses are weakly  palpable bilateral. Capillary return is immediate. Temperature gradient is WNL. Skin turgor WNL  Sensorium: Diminished  Semmes Weinstein monofilament test. Normal tactile sensation bilaterally. Nail Exam: Pt has thick disfigured discolored nails with subungual debris noted bilateral entire nail hallux through fifth toenails Ulcer Exam: There is no evidence of ulcer or pre-ulcerative changes or infection. Orthopedic Exam: Muscle tone and strength are WNL. No limitations in general ROM. No crepitus or effusions noted. Foot type and digits show no abnormalities. HAV  B/L.  Hammer toe second right. Fibroma right foot asymptomatic. Skin: No Porokeratosis. No infection or ulcers  Diagnosis:  Onychomycosis, , Pain in right toe, pain in left toes  Treatment & Plan Procedures and Treatment: Consent by patient was obtained for treatment procedures.   Debridement of mycotic and hypertrophic toenails, 1 through 5 bilateral and clearing of subungual debris. No ulceration, no infection noted.  Patient to make an appointment with pedorthist. Return Visit-Office Procedure: Patient instructed to return to the office for a follow up visit 3 months for continued evaluation and treatment.    Gardiner Barefoot DPM

## 2019-08-31 ENCOUNTER — Other Ambulatory Visit: Payer: Medicare Other | Admitting: Orthotics

## 2019-09-02 ENCOUNTER — Ambulatory Visit: Payer: Medicare Other | Admitting: Orthotics

## 2019-09-02 ENCOUNTER — Other Ambulatory Visit: Payer: Self-pay

## 2019-09-02 DIAGNOSIS — E1142 Type 2 diabetes mellitus with diabetic polyneuropathy: Secondary | ICD-10-CM

## 2019-09-02 DIAGNOSIS — B351 Tinea unguium: Secondary | ICD-10-CM

## 2019-09-02 DIAGNOSIS — M79674 Pain in right toe(s): Secondary | ICD-10-CM

## 2019-09-02 NOTE — Progress Notes (Signed)

## 2019-09-21 ENCOUNTER — Other Ambulatory Visit: Payer: Medicare Other | Admitting: Orthotics

## 2019-09-21 ENCOUNTER — Other Ambulatory Visit: Payer: Self-pay

## 2019-11-02 ENCOUNTER — Telehealth: Payer: Self-pay | Admitting: Podiatry

## 2019-11-02 NOTE — Telephone Encounter (Signed)
Pt left message checking on diabetic shoes.  Returned call and we are waiting on paperwork from pcp.Marland Kitchen

## 2019-11-25 ENCOUNTER — Ambulatory Visit (INDEPENDENT_AMBULATORY_CARE_PROVIDER_SITE_OTHER): Payer: Medicare Other | Admitting: Podiatry

## 2019-11-25 ENCOUNTER — Other Ambulatory Visit: Payer: Self-pay

## 2019-11-25 ENCOUNTER — Encounter: Payer: Self-pay | Admitting: Podiatry

## 2019-11-25 VITALS — Temp 96.4°F

## 2019-11-25 DIAGNOSIS — E1142 Type 2 diabetes mellitus with diabetic polyneuropathy: Secondary | ICD-10-CM | POA: Diagnosis not present

## 2019-11-25 DIAGNOSIS — M2011 Hallux valgus (acquired), right foot: Secondary | ICD-10-CM

## 2019-11-25 DIAGNOSIS — M2012 Hallux valgus (acquired), left foot: Secondary | ICD-10-CM | POA: Diagnosis not present

## 2019-11-25 DIAGNOSIS — B351 Tinea unguium: Secondary | ICD-10-CM | POA: Diagnosis not present

## 2019-11-25 DIAGNOSIS — M79674 Pain in right toe(s): Secondary | ICD-10-CM

## 2019-11-25 DIAGNOSIS — L84 Corns and callosities: Secondary | ICD-10-CM

## 2019-11-25 DIAGNOSIS — M79675 Pain in left toe(s): Secondary | ICD-10-CM | POA: Diagnosis not present

## 2019-11-25 NOTE — Progress Notes (Signed)
This patient returns to my office for at risk foot care.  This patient requires this care by a professional since this patient will be at risk due to having diabetes. Patient is interested in the status of her diabetic shoes.  This patient is unable to cut nails herself since the patient cannot reach her nails.These nails are painful walking and wearing shoes.  This patient presents for at risk foot care today.  General Appearance  Alert, conversant and in no acute stress.  Vascular  Dorsalis pedis and posterior tibial  pulses are weakly  palpable  bilaterally.  Capillary return is within normal limits  bilaterally. Temperature is within normal limits  bilaterally.  Neurologic  Senn-Weinstein monofilament wire test diminished   bilaterally. Muscle power within normal limits bilaterally.  Nails Thick disfigured discolored nails with subungual debris  from hallux to fifth toes bilaterally. No evidence of bacterial infection or drainage bilaterally.  Orthopedic  No limitations of motion  feet .  No crepitus or effusions noted.  No bony pathology or digital deformities noted. HAV  B/L.  Hammer toe second right.  Fibroma right foot asymptomatic.  Skin  normotropic skin with no porokeratosis noted bilaterally.  No signs of infections or ulcers noted.   [place] durum 5th toes  B/L.  Onychomycosis  Pain in right toes  Pain in left toes  Debride corns  B/L  Consent was obtained for treatment procedures.   Mechanical debridement of nails 1-5  bilaterally performed with a nail nipper.  Filed with dremel without incident.  Debride HD with # 15 blade.  Checked on status of diabetic shoes and her doctor never sent the paperwork.   Return office visit   3 months                   Told patient to return for periodic foot care and evaluation due to potential at risk complications.   Gardiner Barefoot DPM

## 2020-03-02 ENCOUNTER — Encounter: Payer: Self-pay | Admitting: Podiatry

## 2020-03-02 ENCOUNTER — Other Ambulatory Visit: Payer: Self-pay

## 2020-03-02 ENCOUNTER — Ambulatory Visit (INDEPENDENT_AMBULATORY_CARE_PROVIDER_SITE_OTHER): Payer: Medicare Other | Admitting: Podiatry

## 2020-03-02 DIAGNOSIS — E1142 Type 2 diabetes mellitus with diabetic polyneuropathy: Secondary | ICD-10-CM

## 2020-03-02 DIAGNOSIS — M79675 Pain in left toe(s): Secondary | ICD-10-CM | POA: Diagnosis not present

## 2020-03-02 DIAGNOSIS — B351 Tinea unguium: Secondary | ICD-10-CM | POA: Diagnosis not present

## 2020-03-02 DIAGNOSIS — Q828 Other specified congenital malformations of skin: Secondary | ICD-10-CM | POA: Diagnosis not present

## 2020-03-02 DIAGNOSIS — M79674 Pain in right toe(s): Secondary | ICD-10-CM

## 2020-03-02 NOTE — Progress Notes (Signed)
This patient returns to my office for at risk foot care.  This patient requires this care by a professional since this patient will be at risk due to having diabetes. Patient is interested in the status of her diabetic shoes.  This patient is unable to cut nails herself since the patient cannot reach her nails.These nails are painful walking and wearing shoes.  This patient presents for at risk foot care today.  General Appearance  Alert, conversant and in no acute stress.  Vascular  Dorsalis pedis and posterior tibial  pulses are weakly  palpable  bilaterally.  Capillary return is within normal limits  bilaterally. Temperature is within normal limits  bilaterally.  Neurologic  Senn-Weinstein monofilament wire test diminished   bilaterally. Muscle power within normal limits bilaterally.  Nails Thick disfigured discolored nails with subungual debris  from hallux to fifth toes bilaterally. No evidence of bacterial infection or drainage bilaterally.  Orthopedic  No limitations of motion  feet .  No crepitus or effusions noted.  No bony pathology or digital deformities noted. HAV  B/L.  Hammer toe second right.  Fibroma right foot asymptomatic.  Skin  normotropic skin with no porokeratosis noted bilaterally.  No signs of infections or ulcers noted.   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 Porokeratosis sub 2 left foot.  Onychomycosis  Pain in right toes  Pain in left toes  Porokeratosis left foot.    Consent was obtained for treatment procedures.   Mechanical debridement of nails 1-5  bilaterally performed with a nail nipper.  Filed with dremel without incident. Debride porokeratosis sib 2  Left foot.   Return office visit   3 months                   Told patient to return for periodic foot care and evaluation due to potential at risk complications.   Gardiner Barefoot DPM

## 2020-03-15 ENCOUNTER — Encounter (INDEPENDENT_AMBULATORY_CARE_PROVIDER_SITE_OTHER): Payer: Self-pay

## 2020-03-23 ENCOUNTER — Ambulatory Visit: Payer: Medicare Other | Admitting: Orthotics

## 2020-03-23 ENCOUNTER — Other Ambulatory Visit: Payer: Self-pay

## 2020-03-23 DIAGNOSIS — E1142 Type 2 diabetes mellitus with diabetic polyneuropathy: Secondary | ICD-10-CM

## 2020-03-23 DIAGNOSIS — M2011 Hallux valgus (acquired), right foot: Secondary | ICD-10-CM | POA: Diagnosis not present

## 2020-03-23 DIAGNOSIS — M2012 Hallux valgus (acquired), left foot: Secondary | ICD-10-CM

## 2020-03-29 ENCOUNTER — Other Ambulatory Visit: Payer: Self-pay

## 2020-03-29 ENCOUNTER — Encounter (INDEPENDENT_AMBULATORY_CARE_PROVIDER_SITE_OTHER): Payer: Self-pay | Admitting: Family Medicine

## 2020-03-29 ENCOUNTER — Ambulatory Visit (INDEPENDENT_AMBULATORY_CARE_PROVIDER_SITE_OTHER): Payer: Medicare Other | Admitting: Family Medicine

## 2020-03-29 VITALS — BP 138/62 | HR 62 | Temp 98.5°F | Ht 66.0 in | Wt 221.0 lb

## 2020-03-29 DIAGNOSIS — R5383 Other fatigue: Secondary | ICD-10-CM | POA: Diagnosis not present

## 2020-03-29 DIAGNOSIS — C569 Malignant neoplasm of unspecified ovary: Secondary | ICD-10-CM

## 2020-03-29 DIAGNOSIS — Z6835 Body mass index (BMI) 35.0-35.9, adult: Secondary | ICD-10-CM | POA: Diagnosis not present

## 2020-03-29 DIAGNOSIS — Z923 Personal history of irradiation: Secondary | ICD-10-CM | POA: Insufficient documentation

## 2020-03-29 DIAGNOSIS — R0602 Shortness of breath: Secondary | ICD-10-CM

## 2020-03-29 DIAGNOSIS — I10 Essential (primary) hypertension: Secondary | ICD-10-CM

## 2020-03-29 DIAGNOSIS — I152 Hypertension secondary to endocrine disorders: Secondary | ICD-10-CM

## 2020-03-29 DIAGNOSIS — Z0289 Encounter for other administrative examinations: Secondary | ICD-10-CM

## 2020-03-29 DIAGNOSIS — C50919 Malignant neoplasm of unspecified site of unspecified female breast: Secondary | ICD-10-CM | POA: Insufficient documentation

## 2020-03-29 DIAGNOSIS — Z1331 Encounter for screening for depression: Secondary | ICD-10-CM

## 2020-03-29 DIAGNOSIS — E1159 Type 2 diabetes mellitus with other circulatory complications: Secondary | ICD-10-CM

## 2020-03-29 DIAGNOSIS — E66812 Obesity, class 2: Secondary | ICD-10-CM

## 2020-03-29 DIAGNOSIS — E1169 Type 2 diabetes mellitus with other specified complication: Secondary | ICD-10-CM | POA: Diagnosis not present

## 2020-03-29 DIAGNOSIS — Z9221 Personal history of antineoplastic chemotherapy: Secondary | ICD-10-CM | POA: Insufficient documentation

## 2020-03-29 DIAGNOSIS — E785 Hyperlipidemia, unspecified: Secondary | ICD-10-CM | POA: Insufficient documentation

## 2020-03-29 DIAGNOSIS — E559 Vitamin D deficiency, unspecified: Secondary | ICD-10-CM

## 2020-03-30 ENCOUNTER — Encounter (INDEPENDENT_AMBULATORY_CARE_PROVIDER_SITE_OTHER): Payer: Self-pay | Admitting: Family Medicine

## 2020-03-30 LAB — CBC WITH DIFFERENTIAL/PLATELET
Basophils Absolute: 0.1 10*3/uL (ref 0.0–0.2)
Basos: 1 %
EOS (ABSOLUTE): 0.5 10*3/uL — ABNORMAL HIGH (ref 0.0–0.4)
Eos: 5 %
Hematocrit: 38.1 % (ref 34.0–46.6)
Hemoglobin: 11.9 g/dL (ref 11.1–15.9)
Immature Grans (Abs): 0 10*3/uL (ref 0.0–0.1)
Immature Granulocytes: 0 %
Lymphocytes Absolute: 2.4 10*3/uL (ref 0.7–3.1)
Lymphs: 25 %
MCH: 23.9 pg — ABNORMAL LOW (ref 26.6–33.0)
MCHC: 31.2 g/dL — ABNORMAL LOW (ref 31.5–35.7)
MCV: 77 fL — ABNORMAL LOW (ref 79–97)
Monocytes Absolute: 0.7 10*3/uL (ref 0.1–0.9)
Monocytes: 7 %
Neutrophils Absolute: 5.9 10*3/uL (ref 1.4–7.0)
Neutrophils: 62 %
Platelets: 343 10*3/uL (ref 150–450)
RBC: 4.98 x10E6/uL (ref 3.77–5.28)
RDW: 14.5 % (ref 11.7–15.4)
WBC: 9.7 10*3/uL (ref 3.4–10.8)

## 2020-03-30 LAB — VITAMIN B12: Vitamin B-12: 1447 pg/mL — ABNORMAL HIGH (ref 232–1245)

## 2020-03-30 LAB — HEMOGLOBIN A1C
Est. average glucose Bld gHb Est-mCnc: 140 mg/dL
Hgb A1c MFr Bld: 6.5 % — ABNORMAL HIGH (ref 4.8–5.6)

## 2020-03-30 LAB — COMPREHENSIVE METABOLIC PANEL
ALT: 12 IU/L (ref 0–32)
AST: 10 IU/L (ref 0–40)
Albumin/Globulin Ratio: 1.6 (ref 1.2–2.2)
Albumin: 4.5 g/dL (ref 3.8–4.8)
Alkaline Phosphatase: 71 IU/L (ref 48–121)
BUN/Creatinine Ratio: 15 (ref 12–28)
BUN: 10 mg/dL (ref 8–27)
Bilirubin Total: 0.3 mg/dL (ref 0.0–1.2)
CO2: 24 mmol/L (ref 20–29)
Calcium: 10.5 mg/dL — ABNORMAL HIGH (ref 8.7–10.3)
Chloride: 103 mmol/L (ref 96–106)
Creatinine, Ser: 0.68 mg/dL (ref 0.57–1.00)
GFR calc Af Amer: 103 mL/min/{1.73_m2} (ref >59)
GFR calc non Af Amer: 90 mL/min/{1.73_m2} (ref >59)
Globulin, Total: 2.9 g/dL (ref 1.5–4.5)
Glucose: 101 mg/dL — ABNORMAL HIGH (ref 65–99)
Potassium: 4 mmol/L (ref 3.5–5.2)
Sodium: 140 mmol/L (ref 134–144)
Total Protein: 7.4 g/dL (ref 6.0–8.5)

## 2020-03-30 LAB — LIPID PANEL
Chol/HDL Ratio: 2.4 ratio (ref 0.0–4.4)
Cholesterol, Total: 123 mg/dL (ref 100–199)
HDL: 52 mg/dL (ref >39)
LDL Chol Calc (NIH): 57 mg/dL (ref 0–99)
Triglycerides: 65 mg/dL (ref 0–149)
VLDL Cholesterol Cal: 14 mg/dL (ref 5–40)

## 2020-03-30 LAB — INSULIN, RANDOM: INSULIN: 13.8 u[IU]/mL (ref 2.6–24.9)

## 2020-03-30 LAB — T3: T3, Total: 114 ng/dL (ref 71–180)

## 2020-03-30 LAB — T4: T4, Total: 8.4 ug/dL (ref 4.5–12.0)

## 2020-03-30 LAB — TSH: TSH: 0.883 u[IU]/mL (ref 0.450–4.500)

## 2020-03-30 LAB — FOLATE: Folate: >20 ng/mL (ref >3.0)

## 2020-03-30 LAB — VITAMIN D 25 HYDROXY (VIT D DEFICIENCY, FRACTURES): Vit D, 25-Hydroxy: 51.1 ng/mL (ref 30.0–100.0)

## 2020-03-30 NOTE — Progress Notes (Signed)
Dear Debbie [name],   Thank you for referring Debbie [name] to our clinic. The following note includes my evaluation and treatment recommendations.  Chief Complaint:   OBESITY Debbie [name] (MR# [id number]) is a 69 y.o. female who presents for evaluation and treatment of obesity and related comorbidities. Current BMI is Body mass index is 35.67 kg/m. Debbie [name] has been struggling with her weight for many years and has been unsuccessful in either losing weight, maintaining weight loss, or reaching her healthy weight goal.  Debbie [name] is currently in the action stage of change and ready to dedicate time achieving and maintaining a healthier weight. Debbie [name] is interested in becoming our patient and working on intensive lifestyle modifications including (but not limited to) diet and exercise for weight loss.  Debbie [name] lives with Debbie [name] and Debbie [name], her son and daughter.  She walks 3-4 mils per day at an indoor track at the [name], Monday through Friday.  She often skips meals because she is "not hungry".  She goes to the [facility] everyday.  Her PCP is at [facility].  She last saw her in February 2021.  She has had no labs since then.  Debbie [name]'s habits were reviewed today and are as follows: Her family eats meals together, she thinks her family will eat healthier with her, her desired weight loss is 65 pounds, she has been heavy most of her life, she started gaining weight after childbirth, her heaviest weight ever was 260 pounds and she skips meals frequently.  Depression Screen Debbie [name] (modified PHQ-9) score was 1.  Depression screen Debbie [name] 2/9 03/29/2020  Decreased Interest 0  Down, Depressed, Hopeless 0  PHQ - 2 Score 0  Altered sleeping 0  Tired, decreased energy 1  Change in appetite 0  Feeling bad or failure about yourself  0  Trouble concentrating 0  Moving slowly or fidgety/restless 0  Suicidal thoughts 0  PHQ-9 Score 1  Difficult doing work/chores Not difficult  at all   Subjective:   1. Other fatigue Debbie [name] denies daytime somnolence and reports waking up still tired. Patent has a history of symptoms of morning fatigue. Debbie [name] generally gets 5 hours of sleep per night, and states that she has generally restful sleep. Snoring is present. Apneic episodes are not present. Epworth Sleepiness Score is 2.  2. SOB (shortness of breath) on exertion Debbie [name] notes increasing shortness of breath with exercising and seems to be worsening over time with weight gain. She notes getting out of breath sooner with activity than she used to. This has gotten worse recently. Debbie [name] denies shortness of breath at rest or orthopnea.  3. Type 2 diabetes mellitus with other specified complication, without long-term current use of insulin (HCC) Medications reviewed.  On Januvia, Metformin.  Denies S-E.  ROS: no polyuria or polydipsia, no chest pain, no numbness, tingling or pain in extremities.  A1c was 6.9 a couple of months ago.  Lab Results  Component Value Date   HGBA1C 6.5 (H) 03/29/2020   HGBA1C 8.4% 11/04/2015   Lab Results  Component Value Date   LDLCALC 57 03/29/2020   CREATININE 0.68 03/29/2020   Lab Results  Component Value Date   INSULIN 13.8 03/29/2020   4. Hypertension associated with type 2 diabetes mellitus ([place]) Review: taking medications as instructed, Zestoretic, no medication side effects noted, no chest pain on exertion, no dyspnea on exertion, no new swelling of ankles.    BP Readings from Last 3 Encounters:  03/29/20  138/62  05/26/19 (!) 166/81  12/18/18 (!) 144/71   5. Hyperlipidemia associated with type 2 diabetes mellitus (Debbie [name]) Debbie [name] has hyperlipidemia and has been trying to improve her cholesterol levels with intensive lifestyle modification including a low saturated fat diet, exercise and weight loss.   Also takes meds- lipitor.     She denies any chest pain, claudication or myalgias.  Lab Results  Component Value Date   ALT 12  03/29/2020   AST 10 03/29/2020   ALKPHOS 71 03/29/2020   BILITOT 0.3 03/29/2020   Lab Results  Component Value Date   CHOL 123 03/29/2020   HDL 52 03/29/2020   LDLCALC 57 03/29/2020   TRIG 65 03/29/2020   CHOLHDL 2.4 03/29/2020   6. Malignant neoplasm of ovary, unspecified laterality ([place]) Status post hysterectomy.  Status post chemo and radiation 8-10 years ago.  7. 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  8. Vitamin D deficiency She is currently taking OTC vitamin D 2000 IU each day. She denies nausea, vomiting or muscle weakness.  9. Hypomagnesemia Debbie [name] takes an OTC magnesium supplement daily.  10. Depression screening Debbie [name] was screened for depression as part of her new patient workup.  PHQ-9 is 1.  Assessment/Plan:   1. Other fatigue Debbie [name] does feel that her weight is causing her energy to be lower than it should be. Fatigue may be related to obesity, depression or many other causes. Labs will be ordered, and in the meanwhile, Debbie [name] will focus on self care including making healthy food choices, increasing physical activity and focusing on stress reduction. - EKG 12-Lead - Vitamin B12 - CBC with Differential/Platelet - Comprehensive metabolic panel - Folate - Hemoglobin A1c - Insulin, random - Lipid panel - T3 - T4 - TSH - VITAMIN D 25 Hydroxy (Vit-D Deficiency, Fractures)  2. SOB (shortness of breath) on exertion Debbie [name] does feel that she gets out of breath more easily that she used to when she exercises. Debbie [name]'s shortness of breath appears to be obesity related and exercise induced. She has agreed to work on weight loss and gradually increase exercise to treat her exercise induced shortness of breath - check labs and we will continue to monitor closely.  - Vitamin B12 - CBC with Differential/Platelet - Comprehensive metabolic  panel - Folate - Hemoglobin A1c - Insulin, random - Lipid panel - T3 - T4 - TSH - VITAMIN D 25 Hydroxy (Vit-D Deficiency, Fractures)  3. Type 2 diabetes mellitus with other specified complication, without long-term current use of insulin (HCC) Good blood sugar control is important to decrease the likelihood of diabetic complications such as nephropathy, neuropathy, limb loss, blindness, coronary artery disease, and death. Intensive lifestyle modification including diet, exercise and weight loss are the first line of treatment for diabetes.   - Cont meds per PCP and f/up with them as instructed - Check labs - PNP and wt loss  - Vitamin B12 - CBC with Differential/Platelet - Comprehensive metabolic panel - Folate - Hemoglobin A1c - Insulin, random - Lipid panel - T3 - T4 - TSH - VITAMIN D 25 Hydroxy (Vit-D Deficiency, Fractures)  4. Hypertension associated with type 2 diabetes mellitus ([place]) Debbie [name] is working on healthy weight loss and exercise to improve blood pressure control. We will watch for signs of hypotension as she continues her lifestyle modifications. - Cont meds per PCP, BP at goal today and controlled - PNP and wt loss  -  CBC with Differential/Platelet - Comprehensive metabolic panel  5. Hyperlipidemia associated with type 2 diabetes mellitus ([place]) Cardiovascular risk d/c pt.  We discussed several lifestyle modifications today and Debbie [name] will continue to work on diet, exercise and weight loss efforts. Orders and follow up as documented in patient record.  - Cont meds per PCP- lipitor - Check labs - PNP, wt loss  Counseling Intensive lifestyle modifications are the first line treatment for this issue. . Dietary changes: Increase soluble fiber. Decrease simple carbohydrates. . Exercise changes: Moderate to vigorous-intensity aerobic activity 150 minutes per week if tolerated. . Lipid-lowering medications: see documented in medical record. .  - Lipid  panel  6. Malignant neoplasm of ovary, unspecified laterality Debbie [name] - [place]) Check labs, follow-up with specialists as scheduled.  7. Malignant neoplasm of female breast, unspecified estrogen receptor status, unspecified laterality, unspecified site of breast [facility]) Check labs, follow-up with specialists as scheduled.  - Vitamin B12 - CBC with Differential/Platelet - Comprehensive metabolic panel - Folate - Hemoglobin A1c - Insulin, random - Lipid panel - T3 - T4 - TSH - VITAMIN D 25 Hydroxy (Vit-D Deficiency, Fractures)  8. Vitamin D deficiency Low Vitamin D level contributes to fatigue and are associated with obesity, breast, and colon cancer.   Will check vitamin D level today. - Cont otc med for now - obtain bone density testing per pcp/ specialists  - VITAMIN D 25 Hydroxy (Vit-D Deficiency, Fractures)  9. Hypomagnesemia Asx but we will check magnesium level today.  - Magnesium  10. Depression screening Depression screening is negative.  11. Class 2 severe obesity with serious comorbidity and body mass index (BMI) of 35.0 to 35.9 in adult, unspecified obesity type (HCC) Faelynn is currently in the action stage of change and her goal is to continue with weight loss efforts. I recommend Max begin the structured treatment plan as follows:  She has agreed to the Category 2 Plan with additional lunch options.  Exercise goals: As is.   Behavioral modification strategies: increasing lean protein intake, decreasing simple carbohydrates, meal planning and cooking strategies, keeping healthy foods in the home and planning for success.  She was informed of the importance of frequent follow-up visits to maximize her success with intensive lifestyle modifications for her multiple health conditions. She was informed we would discuss her lab results at her next visit unless there is a critical issue that needs to be addressed sooner. Brandalyn agreed to keep her next visit at the agreed upon  time to discuss these results.  Objective:   Blood pressure 138/62, pulse 62, temperature 98.5 F (36.9 C), height 5\' 6"  (1.676 m), weight 221 lb (100.2 kg), SpO2 98 %. Body mass index is 35.67 kg/m.  EKG: Normal sinus rhythm, rate 55 bpm.  Indirect Calorimeter completed today shows a VO2 of 274 and a REE of 1906.  Her calculated basal metabolic rate is 2263 thus her basal metabolic rate is better than expected.  General: Cooperative, alert, well developed, in no acute distress. HEENT: Conjunctivae and lids unremarkable. Cardiovascular: Regular rhythm.  Lungs: Normal work of breathing. Neurologic: No focal deficits.   Lab Results  Component Value Date   CREATININE 0.68 03/29/2020   BUN 10 03/29/2020   NA 140 03/29/2020   K 4.0 03/29/2020   CL 103 03/29/2020   CO2 24 03/29/2020   Lab Results  Component Value Date   ALT 12 03/29/2020   AST 10 03/29/2020   ALKPHOS 71 03/29/2020   BILITOT 0.3 03/29/2020   Lab  Results  Component Value Date   HGBA1C 6.5 (H) 03/29/2020   HGBA1C 8.4% 11/04/2015   Lab Results  Component Value Date   INSULIN 13.8 03/29/2020   Lab Results  Component Value Date   TSH 0.883 03/29/2020   Lab Results  Component Value Date   CHOL 123 03/29/2020   HDL 52 03/29/2020   LDLCALC 57 03/29/2020   TRIG 65 03/29/2020   CHOLHDL 2.4 03/29/2020   Lab Results  Component Value Date   WBC 9.7 03/29/2020   HGB 11.9 03/29/2020   HCT 38.1 03/29/2020   MCV 77 (L) 03/29/2020   PLT 343 03/29/2020   Obesity Behavioral Intervention Visit Documentation for Insurance:   Approximately 15 minutes were spent on the discussion below.  ASK: We discussed the diagnosis of obesity with Verlena today and Faiga agreed to give [place] permission to discuss obesity behavioral modification therapy today.  ASSESS: Larine has the diagnosis of obesity and her BMI today is 35.7. December is in the action stage of change.   ADVISE: Anaissa was educated on the multiple health  risks of obesity as well as the benefit of weight loss to improve her health. She was advised of the need for long term treatment and the importance of lifestyle modifications to improve her current health and to decrease her risk of future health problems.  AGREE: Multiple dietary modification options and treatment options were discussed and Abbigale agreed to follow the recommendations documented in the above note.  ARRANGE: Donie was educated on the importance of frequent visits to treat obesity as outlined per CMS and USPSTF guidelines and agreed to schedule her next follow up appointment today.  Attestation Statements:   Reviewed by clinician on day of visit: allergies, medications, problem list, medical history, surgical history, family history, social history, and previous encounter notes.  I, Water quality scientist, CMA, am acting as Location manager for [employer], DO.  I have reviewed the above documentation for accuracy and completeness, and I agree with the above. Mellody Dance, DO

## 2020-04-09 ENCOUNTER — Encounter (INDEPENDENT_AMBULATORY_CARE_PROVIDER_SITE_OTHER): Payer: Self-pay | Admitting: Family Medicine

## 2020-04-12 ENCOUNTER — Telehealth: Payer: Self-pay | Admitting: Adult Health

## 2020-04-12 ENCOUNTER — Telehealth (INDEPENDENT_AMBULATORY_CARE_PROVIDER_SITE_OTHER): Payer: Self-pay

## 2020-04-12 ENCOUNTER — Telehealth (INDEPENDENT_AMBULATORY_CARE_PROVIDER_SITE_OTHER): Payer: Medicare Other | Admitting: Family Medicine

## 2020-04-12 ENCOUNTER — Other Ambulatory Visit: Payer: Self-pay

## 2020-04-12 VITALS — Ht 66.0 in | Wt 217.0 lb

## 2020-04-12 DIAGNOSIS — E559 Vitamin D deficiency, unspecified: Secondary | ICD-10-CM | POA: Diagnosis not present

## 2020-04-12 DIAGNOSIS — E1169 Type 2 diabetes mellitus with other specified complication: Secondary | ICD-10-CM

## 2020-04-12 DIAGNOSIS — E1159 Type 2 diabetes mellitus with other circulatory complications: Secondary | ICD-10-CM | POA: Diagnosis not present

## 2020-04-12 DIAGNOSIS — U071 COVID-19: Secondary | ICD-10-CM | POA: Diagnosis not present

## 2020-04-12 DIAGNOSIS — Z6835 Body mass index (BMI) 35.0-35.9, adult: Secondary | ICD-10-CM

## 2020-04-12 DIAGNOSIS — I152 Hypertension secondary to endocrine disorders: Secondary | ICD-10-CM

## 2020-04-12 DIAGNOSIS — E66812 Obesity, class 2: Secondary | ICD-10-CM

## 2020-04-12 DIAGNOSIS — E785 Hyperlipidemia, unspecified: Secondary | ICD-10-CM

## 2020-04-12 DIAGNOSIS — I10 Essential (primary) hypertension: Secondary | ICD-10-CM

## 2020-04-12 NOTE — Telephone Encounter (Signed)
Verbal consent for telehealth visit.   Debbie [name], [place]

## 2020-04-12 NOTE — Telephone Encounter (Signed)
Called to Discuss with patient about Covid symptoms and the use of the monoclonal antibody infusion for those with mild to moderate Covid symptoms and at a high risk of hospitalization.     Pt is qualified for this infusion due to co-morbid conditions and/or a member of an at-risk group.    Unable to reach, left message to return call for infusion hotline.     Patient Active Problem List   Diagnosis Date Noted  . Hypomagnesemia 03/29/2020  . Vitamin D deficiency 03/29/2020  . Hypertension associated with type 2 diabetes mellitus ([place]) 03/29/2020  . Hyperlipidemia associated with type 2 diabetes mellitus ([place]) 03/29/2020  . Malignant neoplasm of ovary ([place]) 03/29/2020  . Malignant neoplasm of female breast ([place]) 03/29/2020  . S/P chemotherapy, time since greater than 12 weeks 03/29/2020  . S/P radiation therapy greater than twelve weeks ago 03/29/2020  . SOB (shortness of breath) on exertion 03/29/2020  . Other fatigue 03/29/2020  . Porokeratosis 03/02/2020  . Heloma durum 11/25/2019  . Hav (hallux abducto valgus), left 11/25/2019  . Hav (hallux abducto valgus), right 11/25/2019  . Pain due to onychomycosis of toenails of both feet 05/26/2019  . Diabetic neuropathy ([place]) 05/26/2019  . BRCA gene mutation positive 12/10/2017  . History of ovarian cancer 12/10/2017  . 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 Morbid obesity ([place]) 11/01/2014  . Diabetes mellitus ([place]) 10/31/2013  . History of malignant neoplasm of right breast 03/18/2012

## 2020-04-13 ENCOUNTER — Encounter: Payer: Self-pay | Admitting: Adult Health

## 2020-04-13 ENCOUNTER — Encounter (HOSPITAL_COMMUNITY): Payer: Self-pay

## 2020-04-13 ENCOUNTER — Other Ambulatory Visit (HOSPITAL_COMMUNITY): Payer: Self-pay | Admitting: Adult Health

## 2020-04-13 ENCOUNTER — Ambulatory Visit (HOSPITAL_COMMUNITY)
Admission: RE | Admit: 2020-04-13 | Discharge: 2020-04-13 | Disposition: A | Discharge status: Home / Self Care | Payer: Medicare Other | Source: Ambulatory Visit | Attending: Pulmonary Disease | Admitting: Pulmonary Disease

## 2020-04-13 DIAGNOSIS — U071 COVID-19: Secondary | ICD-10-CM | POA: Diagnosis present

## 2020-04-13 DIAGNOSIS — Z23 Encounter for immunization: Secondary | ICD-10-CM | POA: Insufficient documentation

## 2020-04-13 MED ORDER — FAMOTIDINE IN NACL 20-0.9 MG/50ML-% IV SOLN: 20.0000 mg | Freq: Once | INTRAVENOUS | Status: DC | PRN

## 2020-04-13 MED ORDER — ALBUTEROL SULFATE HFA 108 (90 BASE) MCG/ACT IN AERS: 2.0000 | INHALATION_SPRAY | Freq: Once | RESPIRATORY_TRACT | Status: DC | PRN

## 2020-04-13 MED ORDER — EPINEPHRINE 0.3 MG/0.3ML IJ SOAJ: 0.3000 mg | Freq: Once | INTRAMUSCULAR | Status: DC | PRN

## 2020-04-13 MED ORDER — DIPHENHYDRAMINE HCL 50 MG/ML IJ SOLN: 50.0000 mg | Freq: Once | INTRAMUSCULAR | Status: DC | PRN

## 2020-04-13 MED ORDER — METHYLPREDNISOLONE SODIUM SUCC 125 MG IJ SOLR: 125.0000 mg | Freq: Once | INTRAMUSCULAR | Status: DC | PRN

## 2020-04-13 MED ORDER — SODIUM CHLORIDE 0.9 % IV SOLN
1200.0000 mg | Freq: Once | INTRAVENOUS | Status: AC
  Administered 2020-04-13: 1200 mg via INTRAVENOUS
  Filled 2020-04-13: qty 10

## 2020-04-13 MED ORDER — SODIUM CHLORIDE 0.9 % IV SOLN: INTRAVENOUS | Status: DC | PRN

## 2020-04-13 NOTE — Progress Notes (Signed)
  Diagnosis: COVID-19  Physician: Dr. Joya Gaskins   Procedure: Covid Infusion Clinic Med: casirivimab\imdevimab infusion - Provided patient with casirivimab\imdevimab fact sheet for patients, parents and caregivers prior to infusion.  Complications: No immediate complications noted.  Discharge: Discharged home   Estrella Deeds 04/13/2020

## 2020-04-13 NOTE — Progress Notes (Signed)
I connected by phone with Debbie [name] on 04/13/2020 at 10:24 AM to discuss the potential use of a new treatment for mild to moderate COVID-19 viral infection in non-hospitalized patients.  This patient is a 69 y.o. female that meets the FDA criteria for Emergency Use Authorization of COVID monoclonal antibody casirivimab/imdevimab.  Has a (+) direct SARS-CoV-2 viral test result  Has mild or moderate COVID-19   Is NOT hospitalized due to COVID-19  Is within 10 days of symptom onset  Has at least one of the high risk factor(s) for progression to severe COVID-19 and/or hospitalization as defined in EUA.  Specific high risk criteria : Older age (>/= 69 yo), Diabetes and Cardiovascular disease or hypertension   I have spoken and communicated the following to the patient or parent/caregiver regarding COVID monoclonal antibody treatment:  1. FDA has authorized the emergency use for the treatment of mild to moderate COVID-19 in adults and pediatric patients with positive results of direct SARS-CoV-2 viral testing who are 48 years of age and older weighing at least 40 kg, and who are at high risk for progressing to severe COVID-19 and/or hospitalization.  2. The significant known and potential risks and benefits of COVID monoclonal antibody, and the extent to which such potential risks and benefits are unknown.  3. Information on available alternative treatments and the risks and benefits of those alternatives, including clinical trials.  4. Patients treated with COVID monoclonal antibody should continue to self-isolate and use infection control measures (e.g., wear mask, isolate, social distance, avoid sharing personal items, clean and disinfect "high touch" surfaces, and frequent handwashing) according to CDC guidelines.   5. The patient or parent/caregiver has the option to accept or refuse COVID monoclonal antibody treatment.  After reviewing this information with the patient, The  patient agreed to proceed with receiving casirivimab\imdevimab infusion and will be provided a copy of the Fact sheet prior to receiving the infusion. Scot Dock 04/13/2020 10:24 AM

## 2020-04-13 NOTE — Discharge Instructions (Signed)

## 2020-04-13 NOTE — Progress Notes (Signed)
Chief Complaint:   OBESITY Debbie [name] is here to discuss her progress with her obesity treatment plan along with follow-up of her obesity related diagnoses. Debbie [name] is on the Category 2 Plan and states she is following her eating plan approximately 25% of the time. Debbie [name] states she is walking and/or using machines for 2 hours minutes 4 times per week.  Today's visit was #: 2 Starting weight: 221 lbs Starting date: 03/29/2020  Interim History: Debbie [name]'s daughter, Debbie [name], caught COVID from school.  Her daughter lives with her and her son, her other daughter, and her husband.  Debbie [name] and tested positive for COVID.  Her PCP is at [facility] and [place] called them for further care.  Her symptoms started yesterday, early in the morning.  She has coughing while in bed.    Yesterday, at her PCP's office, she had a positive COVID test and was not told about the antibody infusion clinic.  She was given "zithromax, and inhaler, and some steroids and told to go home".  She says she was never told about the infusion clinic.  She has concerns and cannot reach her PCP's office today.  Subjective:   1. Type 2 diabetes mellitus with other specified complication, without long-term current use of insulin (HCC) Debbie [name] has not been checking her blood sugars, but has no complaints today.  She is on metformin and Januvia.  Denies concerns with medications.  Lab Results  Component Value Date   HGBA1C 6.5 (H) 03/29/2020   HGBA1C 8.4% 11/04/2015   Lab Results  Component Value Date   LDLCALC 57 03/29/2020   CREATININE 0.68 03/29/2020   Lab Results  Component Value Date   INSULIN 13.8 03/29/2020   2. Hypertension associated with type 2 diabetes mellitus ([place]) She is not checking her blood pressure at home and has a machine to do so.  She is on lisinopril-daily.  Denies concerns, but has had a headache and is not feeling well with acute infection.  BP Readings from Last 3 Encounters:    04/13/20 140/67  03/29/20 138/62  05/26/19 (!) 166/81   3. Hyperlipidemia associated with type 2 diabetes mellitus ([employer]) Debbie [name] is on Lipitor 20 mg at night and 81 mg of aspirin daily.  Lab Results  Component Value Date   ALT 12 03/29/2020   AST 10 03/29/2020   ALKPHOS 71 03/29/2020   BILITOT 0.3 03/29/2020   Lab Results  Component Value Date   CHOL 123 03/29/2020   HDL 52 03/29/2020   LDLCALC 57 03/29/2020   TRIG 65 03/29/2020   CHOLHDL 2.4 03/29/2020   4. Vitamin D deficiency Debbie [name]'s Vitamin D level was 51.1 on 03/29/2020. She is currently taking an OTC vitamin D supplement. She denies nausea, vomiting or muscle weakness.  Vitamin D level is at goal at 51.0.    5. COVID-19 virus infection Positive cough, headache.  Tested positive at [facility] yesterday and was given antibiotic, steroids, and inhaler and was sent home.  She cannot reach her PCP's office today to discuss concerns.  Denies shortness of breath or hypoxia, but no way to check at home.  Denies fever/chills or nausea/vomiting.  Assessment/Plan:   1. Type 2 diabetes mellitus with other specified complication, without long-term current use of insulin ([place]) Discussed labs with patient today.  Advised her to follow blood sugars closely, especially when she is now [name] with COVID/not well.  Check FBS and 2 hour postprandial and anytime she is not feeling  well.  Continue prudent nutritional plan and weight loss.  2. Hypertension associated with type 2 diabetes mellitus ([place]) Discussed labs with patient today.  Check blood pressure at home every other day at least.  Keep a log and bring in to each office visit along with blood sugars.  Continue medications.  CMP and CBC stable.  3. Hyperlipidemia associated with type 2 diabetes mellitus ([place]) Discussed labs with patient today.  LDL is at goal of <70 at 57, triglycerides 65, and HDL 52.  Continue medications.  Continue risk reduction via medications.  Continue  prudent nutritional plan and weight loss.  4. Vitamin D deficiency Discussed labs with patient today.  Continue vitamin D supplement as vitamin D level is at goal.  5. COVID-19 virus infection Discussed labs with patient today from [facility].  Extensive education done with her regarding monoclonal antibody infusion in high risk individuals such as herself for progression to severe COVID-19 infection.  Advised her that she will need to receive treatment as soon as possible.  Gave her the number to call and I called as well on her behalft to coordinate care at [phone].  I explained to her the next steps in care of coordination and what to expect.  Specific patient information was given to the antibody infusion center so they can contact the patient for an appointment.  I asked her to also call herself as well and leave a message.  If her condition worsens/red flag symptoms develop, she was instructed to go to the ED.  6. Class 2 severe obesity with serious comorbidity and body mass index (BMI) of 35.0 to 35.9 in adult, unspecified obesity type (HCC) Debbie [name] is currently in the action stage of change. As such, her goal is to continue with weight loss efforts. She has agreed to the Category 2 Plan.   Exercise goals: As is.  Behavioral modification strategies: increasing water intake, no skipping meals and keeping healthy foods in the home.  [name] has agreed to follow-up with our clinic in 2-3 weeks after she is over COVID infection for at least 10 days have passed and symptoms need to be resolving, as well, to come in. She was informed of the importance of frequent follow-up visits to maximize her success with intensive lifestyle modifications for her multiple health conditions.   Objective:   Height 5\' 6"  (1.676 m), weight 217 lb (98.4 kg). Body mass index is 35.02 kg/m.  General: Cooperative, alert, well developed, in no acute distress. HEENT: Conjunctivae and lids  unremarkable. Cardiovascular: Regular rhythm.  Lungs: Normal work of breathing. Neurologic: No focal deficits.   Lab Results  Component Value Date   CREATININE 0.68 03/29/2020   BUN 10 03/29/2020   NA 140 03/29/2020   K 4.0 03/29/2020   CL 103 03/29/2020   CO2 24 03/29/2020   Lab Results  Component Value Date   ALT 12 03/29/2020   AST 10 03/29/2020   ALKPHOS 71 03/29/2020   BILITOT 0.3 03/29/2020   Lab Results  Component Value Date   HGBA1C 6.5 (H) 03/29/2020   HGBA1C 8.4% 11/04/2015   Lab Results  Component Value Date   INSULIN 13.8 03/29/2020   Lab Results  Component Value Date   TSH 0.883 03/29/2020   Lab Results  Component Value Date   CHOL 123 03/29/2020   HDL 52 03/29/2020   LDLCALC 57 03/29/2020   TRIG 65 03/29/2020   CHOLHDL 2.4 03/29/2020   Lab Results  Component Value Date  WBC 9.7 03/29/2020   HGB 11.9 03/29/2020   HCT 38.1 03/29/2020   MCV 77 (L) 03/29/2020   PLT 343 03/29/2020   Attestation Statements:   Reviewed by clinician on day of visit: allergies, medications, problem list, medical history, surgical history, family history, social history, and previous encounter notes.  Time spent on visit including pre-visit chart review and post-visit care and charting was 65+ minutes.  This included office visit care and coordination at antibody infusion center.   I, Water quality scientist, CMA, am acting as Location manager for [employer], DO.  I have reviewed the above documentation for accuracy and completeness, and I agree with the above. -  Mellody Dance, DO

## 2020-04-14 ENCOUNTER — Encounter (INDEPENDENT_AMBULATORY_CARE_PROVIDER_SITE_OTHER): Payer: Self-pay | Admitting: Family Medicine

## 2020-06-08 ENCOUNTER — Ambulatory Visit (INDEPENDENT_AMBULATORY_CARE_PROVIDER_SITE_OTHER): Payer: Medicare Other | Admitting: Podiatry

## 2020-06-08 ENCOUNTER — Encounter: Payer: Self-pay | Admitting: Podiatry

## 2020-06-08 ENCOUNTER — Other Ambulatory Visit: Payer: Self-pay

## 2020-06-08 DIAGNOSIS — M2012 Hallux valgus (acquired), left foot: Secondary | ICD-10-CM | POA: Diagnosis not present

## 2020-06-08 DIAGNOSIS — B351 Tinea unguium: Secondary | ICD-10-CM

## 2020-06-08 DIAGNOSIS — M79675 Pain in left toe(s): Secondary | ICD-10-CM | POA: Diagnosis not present

## 2020-06-08 DIAGNOSIS — E1142 Type 2 diabetes mellitus with diabetic polyneuropathy: Secondary | ICD-10-CM

## 2020-06-08 DIAGNOSIS — L84 Corns and callosities: Secondary | ICD-10-CM

## 2020-06-08 DIAGNOSIS — M79674 Pain in right toe(s): Secondary | ICD-10-CM

## 2020-06-08 NOTE — Progress Notes (Signed)
This patient returns to my office for at risk foot care.  This patient requires this care by a professional since this patient will be at risk due to having diabetes. Patient is interested in the status of her diabetic shoes.  This patient is unable to cut nails herself since the patient cannot reach her nails.These nails are painful walking and wearing shoes.  This patient presents for at risk foot care today.  General Appearance  Alert, conversant and in no acute stress.  Vascular  Dorsalis pedis and posterior tibial  pulses are weakly  palpable  bilaterally.  Capillary return is within normal limits  bilaterally. Temperature is within normal limits  bilaterally.  Neurologic  Senn-Weinstein monofilament wire test diminished   bilaterally. Muscle power within normal limits bilaterally.  Nails Thick disfigured discolored nails with subungual debris  from hallux to fifth toes bilaterally. No evidence of bacterial infection or drainage bilaterally.  Orthopedic  No limitations of motion  feet .  No crepitus or effusions noted.  No bony pathology or digital deformities noted. HAV  B/L.  Hammer toe second right.  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  Skin  normotropic skin with no porokeratosis noted bilaterally.  No signs of infections or ulcers noted.   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 Porokeratosis sub 2 left foot asymptomatic.Marland Kitchen  Onychomycosis  Pain in right toes  Pain in left toes  Porokeratosis left foot.    Consent was obtained for treatment procedures.   Mechanical debridement of nails 1-5  bilaterally performed with a nail nipper.  Filed with dremel without incident. Debride porokeratosis sib 2  Left foot.   Return office visit   3 months                   Told patient to return for periodic foot care and evaluation due to potential at risk complications.   Gardiner Barefoot DPM

## 2020-09-14 ENCOUNTER — Ambulatory Visit (INDEPENDENT_AMBULATORY_CARE_PROVIDER_SITE_OTHER): Payer: Medicare Other | Admitting: Podiatry

## 2020-09-14 ENCOUNTER — Other Ambulatory Visit: Payer: Self-pay

## 2020-09-14 ENCOUNTER — Encounter: Payer: Self-pay | Admitting: Podiatry

## 2020-09-14 DIAGNOSIS — M79675 Pain in left toe(s): Secondary | ICD-10-CM

## 2020-09-14 DIAGNOSIS — B351 Tinea unguium: Secondary | ICD-10-CM

## 2020-09-14 DIAGNOSIS — M2011 Hallux valgus (acquired), right foot: Secondary | ICD-10-CM

## 2020-09-14 DIAGNOSIS — M79674 Pain in right toe(s): Secondary | ICD-10-CM

## 2020-09-14 DIAGNOSIS — L84 Corns and callosities: Secondary | ICD-10-CM | POA: Diagnosis not present

## 2020-09-14 DIAGNOSIS — D069 Carcinoma in situ of cervix, unspecified: Secondary | ICD-10-CM | POA: Insufficient documentation

## 2020-09-14 DIAGNOSIS — E1142 Type 2 diabetes mellitus with diabetic polyneuropathy: Secondary | ICD-10-CM | POA: Diagnosis not present

## 2020-09-14 DIAGNOSIS — M2012 Hallux valgus (acquired), left foot: Secondary | ICD-10-CM

## 2020-09-14 NOTE — Progress Notes (Addendum)
This patient returns to my office for at risk foot care.  This patient requires this care by a professional since this patient will be at risk due to having diabetes. Patient is interested in the status of her diabetic shoes.  This patient is unable to cut nails herself since the patient cannot reach her nails.These nails are painful walking and wearing shoes.  This patient presents for at risk foot care today.  General Appearance  Alert, conversant and in no acute stress.  Vascular  Dorsalis pedis and posterior tibial  pulses are weakly  palpable  bilaterally.  Capillary return is within normal limits  bilaterally. Temperature is within normal limits  Bilaterally. Absent digital hair.  Neurologic  Senn-Weinstein monofilament wire test diminished   bilaterally. Muscle power within normal limits bilaterally.  Nails Thick disfigured discolored nails with subungual debris  from hallux to fifth toes bilaterally. No evidence of bacterial infection or drainage bilaterally.  Orthopedic  No limitations of motion  feet .  No crepitus or effusions noted.  No bony pathology or digital deformities noted. HAV  B/L.  Hammer toe second right.  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  Skin  normotropic skin with no porokeratosis noted bilaterally.  No signs of infections or ulcers noted.   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 Porokeratosis sub 2 left foot asymptomatic.Marland Kitchen  Onychomycosis  Pain in right toes  Pain in left toes  HD 5th toe  B/L.   Consent was obtained for treatment procedures.   Mechanical debridement of nails 1-5  bilaterally performed with a nail nipper.  Filed with dremel without incident. Debride   HD 5th toes  B/L.   Return office visit   3 months                   Told patient to return for periodic foot care and evaluation due to potential at risk complications.   Gardiner Barefoot DPM

## 2020-12-14 ENCOUNTER — Ambulatory Visit (INDEPENDENT_AMBULATORY_CARE_PROVIDER_SITE_OTHER): Payer: Medicare Other | Admitting: Podiatry

## 2020-12-14 ENCOUNTER — Encounter: Payer: Self-pay | Admitting: Podiatry

## 2020-12-14 ENCOUNTER — Other Ambulatory Visit: Payer: Self-pay

## 2020-12-14 DIAGNOSIS — B351 Tinea unguium: Secondary | ICD-10-CM | POA: Diagnosis not present

## 2020-12-14 DIAGNOSIS — E1142 Type 2 diabetes mellitus with diabetic polyneuropathy: Secondary | ICD-10-CM

## 2020-12-14 DIAGNOSIS — M79675 Pain in left toe(s): Secondary | ICD-10-CM | POA: Diagnosis not present

## 2020-12-14 DIAGNOSIS — Q828 Other specified congenital malformations of skin: Secondary | ICD-10-CM

## 2020-12-14 DIAGNOSIS — M79674 Pain in right toe(s): Secondary | ICD-10-CM | POA: Diagnosis not present

## 2020-12-14 DIAGNOSIS — L84 Corns and callosities: Secondary | ICD-10-CM

## 2020-12-14 NOTE — Progress Notes (Signed)
This patient returns to my office for at risk foot care.  This patient requires this care by a professional since this patient will be at risk due to having diabetes. Patient is interested in the status of her diabetic shoes.  This patient is unable to cut nails herself since the patient cannot reach her nails.These nails are painful walking and wearing shoes.  This patient presents for at risk foot care today.  General Appearance  Alert, conversant and in no acute stress.  Vascular  Dorsalis pedis and posterior tibial  pulses are weakly  palpable  bilaterally.  Capillary return is within normal limits  bilaterally. Temperature is within normal limits  Bilaterally. Absent digital hair.  Neurologic  Senn-Weinstein monofilament wire test diminished   bilaterally. Muscle power within normal limits bilaterally.  Nails Thick disfigured discolored nails with subungual debris  from hallux to fifth toes bilaterally. No evidence of bacterial infection or drainage bilaterally.  Orthopedic  No limitations of motion  feet .  No crepitus or effusions noted.  No bony pathology or digital deformities noted. HAV  B/L.  Hammer toe second right.  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  Skin  normotropic skin with no porokeratosis noted bilaterally.  No signs of infections or ulcers noted.   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 Porokeratosis sub 2 B/L.Marland Kitchen  Onychomycosis  Pain in right toes  Pain in left toes  HD 5th toe  B/L.   Consent was obtained for treatment procedures.   Mechanical debridement of nails 1-5  bilaterally performed with a nail nipper.  Filed with dremel without incident. Debride   HD 5th toes  B/L.\with # 15 blade.   Return office visit   3 months                   Told patient to return for periodic foot care and evaluation due to potential at risk complications.   Gardiner Barefoot DPM

## 2021-03-22 ENCOUNTER — Ambulatory Visit (INDEPENDENT_AMBULATORY_CARE_PROVIDER_SITE_OTHER): Payer: Medicare Other | Admitting: Podiatry

## 2021-03-22 ENCOUNTER — Encounter: Payer: Self-pay | Admitting: Podiatry

## 2021-03-22 ENCOUNTER — Other Ambulatory Visit: Payer: Self-pay

## 2021-03-22 DIAGNOSIS — B351 Tinea unguium: Secondary | ICD-10-CM

## 2021-03-22 DIAGNOSIS — E1142 Type 2 diabetes mellitus with diabetic polyneuropathy: Secondary | ICD-10-CM

## 2021-03-22 DIAGNOSIS — M79674 Pain in right toe(s): Secondary | ICD-10-CM | POA: Diagnosis not present

## 2021-03-22 DIAGNOSIS — L84 Corns and callosities: Secondary | ICD-10-CM

## 2021-03-22 DIAGNOSIS — M79675 Pain in left toe(s): Secondary | ICD-10-CM

## 2021-03-22 DIAGNOSIS — M2011 Hallux valgus (acquired), right foot: Secondary | ICD-10-CM

## 2021-03-22 DIAGNOSIS — M2012 Hallux valgus (acquired), left foot: Secondary | ICD-10-CM

## 2021-03-22 NOTE — Progress Notes (Signed)
This patient returns to my office for at risk foot care.  This patient requires this care by a professional since this patient will be at risk due to having diabetes. Patient is interested in the status of her diabetic shoes.  This patient is unable to cut nails herself since the patient cannot reach her nails.These nails are painful walking and wearing shoes.  This patient presents for at risk foot care today.  General Appearance  Alert, conversant and in no acute stress.  Vascular  Dorsalis pedis and posterior tibial  pulses are weakly  palpable  bilaterally.  Capillary return is within normal limits  bilaterally. Temperature is within normal limits  Bilaterally. Absent digital hair.  Neurologic  Senn-Weinstein monofilament wire test diminished   bilaterally. Muscle power within normal limits bilaterally.  Nails Thick disfigured discolored nails with subungual debris  from hallux to fifth toes bilaterally. No evidence of bacterial infection or drainage bilaterally.  Orthopedic  No limitations of motion  feet .  No crepitus or effusions noted.  No bony pathology or digital deformities noted. HAV  B/L.  Hammer toe second right.  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  Skin  normotropic skin with no porokeratosis noted bilaterally.  No signs of infections or ulcers noted.   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 Porokeratosis sub 2 asymptomatic.  Onychomycosis  Pain in right toes  Pain in left toes  HD 5th toe  B/L.   Consent was obtained for treatment procedures.   Mechanical debridement of nails 1-5  bilaterally performed with a nail nipper.  Filed with dremel without incident. Debride   HD 5th toes  B/L.\with # 15 blade.   Return office visit   3 months                   Told patient to return for periodic foot care and evaluation due to potential at risk complications.   Gardiner Barefoot DPM

## 2021-06-28 ENCOUNTER — Ambulatory Visit: Payer: Medicare Other

## 2021-06-28 ENCOUNTER — Other Ambulatory Visit: Payer: Self-pay

## 2021-06-28 ENCOUNTER — Ambulatory Visit (INDEPENDENT_AMBULATORY_CARE_PROVIDER_SITE_OTHER): Payer: Medicare Other | Admitting: Podiatry

## 2021-06-28 ENCOUNTER — Encounter: Payer: Self-pay | Admitting: Podiatry

## 2021-06-28 DIAGNOSIS — E1142 Type 2 diabetes mellitus with diabetic polyneuropathy: Secondary | ICD-10-CM

## 2021-06-28 DIAGNOSIS — L84 Corns and callosities: Secondary | ICD-10-CM | POA: Diagnosis not present

## 2021-06-28 DIAGNOSIS — B351 Tinea unguium: Secondary | ICD-10-CM | POA: Diagnosis not present

## 2021-06-28 DIAGNOSIS — Q828 Other specified congenital malformations of skin: Secondary | ICD-10-CM | POA: Diagnosis not present

## 2021-06-28 DIAGNOSIS — M79675 Pain in left toe(s): Secondary | ICD-10-CM

## 2021-06-28 DIAGNOSIS — M79674 Pain in right toe(s): Secondary | ICD-10-CM | POA: Diagnosis not present

## 2021-06-28 NOTE — Progress Notes (Signed)
SITUATION Reason for Consult: Evaluation for Prefabricated Diabetic Shoes and Bilateral Custom Diabetic Inserts. Patient / Caregiver Report: Patient wants something relatively dressy  OBJECTIVE DATA: Patient History / Diagnosis: Diabetes Mellitus with complications  Presence of Diabetic Complications: - Peripheral Neuropathy  Current or Previous Devices: Diabetic sneakers with custom insoles  In-Person Foot Examination:  Skin presentation:   Thin, Shiny, Hairless Nail presentation:   Thick, Ingrown, With Fungus Ulcers & Callousing:   None and no history  Toe / Foot Deformities:   - Pes Planus / Cavus - Hindfoot Varus / Valgus - Forefoot ABduction / ADduction  - Hammertoes - Crossover toes - Midfoot collapse - Charcot Deformity   Sensation:    Diminished bilateral lower extremities  Shoe Size: 9.5XW  ORTHOTIC RECOMMENDATION Recommended Devices: - 1x pair prefabricated PDAC approved diabetic shoes: Apex [id number] Black 9.5XW - 3x pair custom-to-patient direct CAM carved diabetic insoles.   GOALS OF SHOES AND INSOLES - Reduce shear and pressure - Reduce / Prevent callus formation - Reduce / Prevent ulceration - Protect the fragile healing compromised diabetic foot.  Patient would benefit from diabetic shoes and inserts as patient has diabetes mellitus and the patient has one or more of the following conditions: - Peripheral neuropathy with evidence of callus formation - Foot deformity - Poor circulation  ACTIONS PERFORMED Patient was casted for insoles via crush box and measured for shoes via brannock device. Procedure was explained and patient tolerated procedure well. All questions were answered and concerns addressed.  PLAN Insurance to be verified and out of pocket cost communicated to patient. Once cost verified and agreed upon and diabetic certification received, casts are to be sent to [facility] for fabrication. Patient is to be called for fitting when devices are  ready.

## 2021-06-28 NOTE — Progress Notes (Addendum)
This patient returns to my office for at risk foot care.  This patient requires this care by a professional since this patient will be at risk due to having diabetes. Patient is interested in the status of her diabetic shoes.  This patient is unable to cut nails herself since the patient cannot reach her nails.These nails are painful walking and wearing shoes.  This patient presents for at risk foot care today.  General Appearance  Alert, conversant and in no acute stress.  Vascular  Dorsalis pedis and posterior tibial  pulses are weakly  palpable  bilaterally.  Capillary return is within normal limits  bilaterally. Temperature is within normal limits  Bilaterally. Absent digital hair.  Neurologic  Senn-Weinstein monofilament wire test diminished   bilaterally. Muscle power within normal limits bilaterally.  Nails Thick disfigured discolored nails with subungual debris  from hallux to fifth toes bilaterally. No evidence of bacterial infection or drainage bilaterally.  Orthopedic  No limitations of motion  feet .  No crepitus or effusions noted.  No bony pathology or digital deformities noted. HAV  B/L.  Hammer toe second right.  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  Skin  normotropic skin with no porokeratosis noted bilaterally.  No signs of infections or ulcers noted.   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 Porokeratosis sub 2 asymptomatic.  Onychomycosis  Pain in right toes  Pain in left toes  HD 5th toe  B/L.   Consent was obtained for treatment procedures.   Mechanical debridement of nails 1-5  bilaterally performed with a nail nipper.  Filed with dremel without incident. Debride   HD 5th toes  B/L.\with # 15 blade.  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   Return office visit   3 months                   Told patient to return for periodic foot care and evaluation due to potential at risk complications.   Gardiner Barefoot DPM

## 2021-10-04 ENCOUNTER — Ambulatory Visit: Payer: Medicare Other | Admitting: Podiatry

## 2021-10-10 ENCOUNTER — Ambulatory Visit (INDEPENDENT_AMBULATORY_CARE_PROVIDER_SITE_OTHER): Payer: Medicare Other | Admitting: Podiatry

## 2021-10-10 ENCOUNTER — Encounter: Payer: Self-pay | Admitting: Podiatry

## 2021-10-10 ENCOUNTER — Other Ambulatory Visit: Payer: Self-pay

## 2021-10-10 DIAGNOSIS — M79674 Pain in right toe(s): Secondary | ICD-10-CM

## 2021-10-10 DIAGNOSIS — B351 Tinea unguium: Secondary | ICD-10-CM

## 2021-10-10 DIAGNOSIS — M2012 Hallux valgus (acquired), left foot: Secondary | ICD-10-CM | POA: Diagnosis not present

## 2021-10-10 DIAGNOSIS — M2011 Hallux valgus (acquired), right foot: Secondary | ICD-10-CM | POA: Diagnosis not present

## 2021-10-10 DIAGNOSIS — M79675 Pain in left toe(s): Secondary | ICD-10-CM

## 2021-10-10 DIAGNOSIS — E1142 Type 2 diabetes mellitus with diabetic polyneuropathy: Secondary | ICD-10-CM | POA: Diagnosis not present

## 2021-10-10 DIAGNOSIS — Q828 Other specified congenital malformations of skin: Secondary | ICD-10-CM | POA: Diagnosis not present

## 2021-10-10 NOTE — Progress Notes (Signed)
This patient returns to my office for at risk foot care.  This patient requires this care by a professional since this patient will be at risk due to having diabetes. Patient is interested in the status of her diabetic shoes.  This patient is unable to cut nails herself since the patient cannot reach her nails.These nails are painful walking and wearing shoes.  This patient presents for at risk foot care today.  General Appearance  Alert, conversant and in no acute stress.  Vascular  Dorsalis pedis and posterior tibial  pulses are weakly  palpable  bilaterally.  Capillary return is within normal limits  bilaterally. Temperature is within normal limits  Bilaterally. Absent digital hair.  Neurologic  Senn-Weinstein monofilament wire test diminished   bilaterally. Muscle power within normal limits bilaterally.  Nails Thick disfigured discolored nails with subungual debris  from hallux to fifth toes bilaterally. No evidence of bacterial infection or drainage bilaterally.  Orthopedic  No limitations of motion  feet .  No crepitus or effusions noted.  No bony pathology or digital deformities noted. HAV  B/L.  Hammer toe second right.  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  Skin  normotropic skin with no porokeratosis noted bilaterally.  No signs of infections or ulcers noted.   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 Porokeratosis sub 2 asymptomatic.  Onychomycosis  Pain in right toes  Pain in left toes  HD 5th toe  B/L.   Consent was obtained for treatment procedures.   Mechanical debridement of nails 1-5  bilaterally performed with a nail nipper.  Filed with dremel without incident. Debride  porokeratosis left foot with # 15 blade. Talked to pedorthist  about her shoes.   Return office visit   3 months                   Told patient to return for periodic foot care and evaluation due to potential at risk complications.   Phuc Kluttz DPM  

## 2021-10-13 ENCOUNTER — Ambulatory Visit (INDEPENDENT_AMBULATORY_CARE_PROVIDER_SITE_OTHER): Payer: Medicare Other

## 2021-10-13 ENCOUNTER — Other Ambulatory Visit: Payer: Self-pay

## 2021-10-13 ENCOUNTER — Ambulatory Visit (INDEPENDENT_AMBULATORY_CARE_PROVIDER_SITE_OTHER): Payer: Medicare Other | Admitting: Podiatry

## 2021-10-13 DIAGNOSIS — M7752 Other enthesopathy of left foot: Secondary | ICD-10-CM | POA: Diagnosis not present

## 2021-10-13 DIAGNOSIS — M779 Enthesopathy, unspecified: Secondary | ICD-10-CM

## 2021-10-13 DIAGNOSIS — M25572 Pain in left ankle and joints of left foot: Secondary | ICD-10-CM

## 2021-10-13 MED ORDER — TRIAMCINOLONE ACETONIDE 10 MG/ML IJ SUSP
10.0000 mg | Freq: Once | INTRAMUSCULAR | Status: AC
  Administered 2021-10-13: 10 mg

## 2021-10-15 NOTE — Progress Notes (Signed)
Subjective:  ? ?Patient ID: Debbie [name], female   DOB: 71 y.o.   MRN: [id number]  ? ?HPI ?Patient presents stating she is developed pain in the left ankle and does not remember specific injury.  States that its been sore and making it hard to walk and has been going on for a little while worse over the last few days ? ? ?ROS ? ? ?   ?Objective:  ?Physical Exam  ?Neurovascular status intact with inflammation pain of the sinus tarsi left with fluid buildup around the joint surface ? ?   ?Assessment:  ?Inflammatory capsulitis of the sinus tarsi left with fluid buildup ? ?   ?Plan:  ?H&P x-ray reviewed sterile prep and injected the sinus tarsi left 3 mg Kenalog 5 mg Xylocaine advised on anti-inflammatories and reappoint to recheck ? ?X-rays indicate that there is no signs of arthritis stress fracture or bony pathology ?   ? ? ?

## 2021-10-18 ENCOUNTER — Other Ambulatory Visit: Payer: Self-pay | Admitting: Podiatry

## 2021-10-18 DIAGNOSIS — M779 Enthesopathy, unspecified: Secondary | ICD-10-CM

## 2021-10-27 ENCOUNTER — Encounter: Payer: Self-pay | Admitting: Podiatry

## 2021-10-27 NOTE — Telephone Encounter (Signed)
Please advise 

## 2022-01-12 ENCOUNTER — Ambulatory Visit (INDEPENDENT_AMBULATORY_CARE_PROVIDER_SITE_OTHER): Payer: Medicare Other | Admitting: Podiatry

## 2022-01-12 ENCOUNTER — Encounter: Payer: Self-pay | Admitting: Podiatry

## 2022-01-12 ENCOUNTER — Ambulatory Visit: Payer: Medicare Other

## 2022-01-12 DIAGNOSIS — L84 Corns and callosities: Secondary | ICD-10-CM

## 2022-01-12 DIAGNOSIS — B351 Tinea unguium: Secondary | ICD-10-CM | POA: Diagnosis not present

## 2022-01-12 DIAGNOSIS — M2011 Hallux valgus (acquired), right foot: Secondary | ICD-10-CM

## 2022-01-12 DIAGNOSIS — Q828 Other specified congenital malformations of skin: Secondary | ICD-10-CM

## 2022-01-12 DIAGNOSIS — M2012 Hallux valgus (acquired), left foot: Secondary | ICD-10-CM

## 2022-01-12 DIAGNOSIS — M79675 Pain in left toe(s): Secondary | ICD-10-CM | POA: Diagnosis not present

## 2022-01-12 DIAGNOSIS — E1142 Type 2 diabetes mellitus with diabetic polyneuropathy: Secondary | ICD-10-CM | POA: Diagnosis not present

## 2022-01-12 DIAGNOSIS — M79674 Pain in right toe(s): Secondary | ICD-10-CM

## 2022-01-12 NOTE — Progress Notes (Signed)
This patient returns to my office for at risk foot care.  This patient requires this care by a professional since this patient will be at risk due to having diabetes. Patient is interested in the status of her diabetic shoes.  This patient is unable to cut nails herself since the patient cannot reach her nails.These nails are painful walking and wearing shoes.  This patient presents for at risk foot care today.  General Appearance  Alert, conversant and in no acute stress.  Vascular  Dorsalis pedis and posterior tibial  pulses are weakly  palpable  bilaterally.  Capillary return is within normal limits  bilaterally. Temperature is within normal limits  Bilaterally. Absent digital hair.  Neurologic  Senn-Weinstein monofilament wire test diminished   bilaterally. Muscle power within normal limits bilaterally.  Nails Thick disfigured discolored nails with subungual debris  from hallux to fifth toes bilaterally. No evidence of bacterial infection or drainage bilaterally.  Orthopedic  No limitations of motion  feet .  No crepitus or effusions noted.  No bony pathology or digital deformities noted. HAV  B/L.  Hammer toe second right.  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  Skin  normotropic skin with no porokeratosis noted bilaterally.  No signs of infections or ulcers noted.   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 Porokeratosis sub 2 asymptomatic.  Onychomycosis  Pain in right toes  Pain in left toes  HD 5th toe  B/L.   Consent was obtained for treatment procedures.   Mechanical debridement of nails 1-5  bilaterally performed with a nail nipper.  Filed with dremel without incident. Debride  porokeratosis left foot with # 15 blade. Talked to pedorthist  about her shoes.   Return office visit   3 months                   Told patient to return for periodic foot care and evaluation due to potential at risk complications.   Sebron Mcmahill DPM  

## 2022-01-12 NOTE — Progress Notes (Signed)
SITUATION Reason for Consult: Evaluation for Prefabricated Diabetic Shoes and Custom Diabetic Inserts. Patient / Caregiver Report: Patient would like well fitting shoes  OBJECTIVE DATA: Patient History / Diagnosis:    ICD-10-CM   1. Diabetic polyneuropathy associated with type 2 diabetes mellitus (HCC)  E11.42     2. Hav (hallux abducto valgus), left  M20.12     3. Hav (hallux abducto valgus), right  M20.11       Physician Treating Diabetes:  Glendon Axe MD  Current or Previous Devices:   Current user  In-Person Foot Examination: Ulcers & Callousing:   Historical Deformities:    Hallux valgus Sensation:    compromised  Shoe Size:     9.5XW  ORTHOTIC RECOMMENDATION Recommended Devices: - 1x pair prefabricated PDAC approved diabetic shoes; Patient Selected Orthofeet 982 Size 9.5XW - 3x pair custom-to-patient PDAC approved vacuum formed diabetic insoles.  GOALS OF SHOES AND INSOLES - Reduce shear and pressure - Reduce / Prevent callus formation - Reduce / Prevent ulceration - Protect the fragile healing compromised diabetic foot.  Patient would benefit from diabetic shoes and inserts as patient has diabetes mellitus and the patient has one or more of the following conditions: - History of partial or complete amputation of the foot - History of previous foot ulceration. - History of pre-ulcerative callus - Peripheral neuropathy with evidence of callus formation - Foot deformity - Poor circulation  ACTIONS PERFORMED Potential out of pocket cost was communicated to patient. Patient understood and consented to measurement and casting. Patient was casted for insoles via crush box and measured for shoes via brannock device. Procedure was explained and patient tolerated procedure well. All questions were answered and concerns addressed. Casts were shipped to central fabrication for HOLD until Certificate of Medical Necessity or otherwise necessary authorization from insurance is  obtained.  PLAN Shoes are to be ordered and casts released from hold once all appropriate paperwork is complete. Patient is to be contacted and scheduled for fitting once shoes and insoles have been fabricated and received.

## 2022-03-14 ENCOUNTER — Encounter (INDEPENDENT_AMBULATORY_CARE_PROVIDER_SITE_OTHER): Payer: Self-pay

## 2022-04-03 ENCOUNTER — Encounter: Payer: Self-pay | Admitting: Podiatry

## 2022-04-20 ENCOUNTER — Ambulatory Visit (INDEPENDENT_AMBULATORY_CARE_PROVIDER_SITE_OTHER): Payer: Medicare Other | Admitting: Podiatry

## 2022-04-20 ENCOUNTER — Encounter: Payer: Self-pay | Admitting: Podiatry

## 2022-04-20 DIAGNOSIS — M79675 Pain in left toe(s): Secondary | ICD-10-CM

## 2022-04-20 DIAGNOSIS — L84 Corns and callosities: Secondary | ICD-10-CM

## 2022-04-20 DIAGNOSIS — B351 Tinea unguium: Secondary | ICD-10-CM

## 2022-04-20 DIAGNOSIS — E1142 Type 2 diabetes mellitus with diabetic polyneuropathy: Secondary | ICD-10-CM | POA: Diagnosis not present

## 2022-04-20 DIAGNOSIS — M79674 Pain in right toe(s): Secondary | ICD-10-CM

## 2022-04-20 NOTE — Progress Notes (Signed)
This patient returns to my office for at risk foot care.  This patient requires this care by a professional since this patient will be at risk due to having diabetes. Patient is interested in the status of her diabetic shoes.  This patient is unable to cut nails herself since the patient cannot reach her nails.These nails are painful walking and wearing shoes.  This patient presents for at risk foot care today.  General Appearance  Alert, conversant and in no acute stress.  Vascular  Dorsalis pedis and posterior tibial  pulses are weakly  palpable  bilaterally.  Capillary return is within normal limits  bilaterally. Temperature is within normal limits  Bilaterally. Absent digital hair.  Neurologic  Senn-Weinstein monofilament wire test diminished   bilaterally. Muscle power within normal limits bilaterally.  Nails Thick disfigured discolored nails with subungual debris  from hallux to fifth toes bilaterally. No evidence of bacterial infection or drainage bilaterally.  Orthopedic  No limitations of motion  feet .  No crepitus or effusions noted.  No bony pathology or digital deformities noted. HAV  B/L.  Hammer toe second right.  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  Skin  normotropic skin with no porokeratosis noted bilaterally.  No signs of infections or ulcers noted.   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 Porokeratosis sub 2 asymptomatic.  Onychomycosis  Pain in right toes  Pain in left toes  HD 5th toe  B/L.   Consent was obtained for treatment procedures.   Mechanical debridement of nails 1-5  bilaterally performed with a nail nipper.  Filed with dremel without incident. Debride  porokeratosis left foot with # 15 blade. Talked to pedorthist  about her shoes.   Return office visit   3 months                   Told patient to return for periodic foot care and evaluation due to potential at risk complications.   Gardiner Barefoot DPM

## 2022-05-21 ENCOUNTER — Ambulatory Visit (INDEPENDENT_AMBULATORY_CARE_PROVIDER_SITE_OTHER): Payer: Medicare Other

## 2022-05-21 DIAGNOSIS — L84 Corns and callosities: Secondary | ICD-10-CM

## 2022-05-21 DIAGNOSIS — E1142 Type 2 diabetes mellitus with diabetic polyneuropathy: Secondary | ICD-10-CM | POA: Diagnosis not present

## 2022-05-21 NOTE — Progress Notes (Signed)
Patient presents today to pick up diabetic shoes and insoles.  Patient was dispensed 1 pair of diabetic shoes and 3 pairs of foam casted diabetic insoles. Fit was satisfactory. Instructions for break-in and wear was reviewed and a copy was given to the patient.   Re-appointment for regularly scheduled diabetic foot care visits or if they should experience any trouble with the shoes or insoles.  

## 2022-07-25 ENCOUNTER — Ambulatory Visit (INDEPENDENT_AMBULATORY_CARE_PROVIDER_SITE_OTHER): Payer: Medicare Other | Admitting: Podiatry

## 2022-07-25 DIAGNOSIS — M79674 Pain in right toe(s): Secondary | ICD-10-CM

## 2022-07-25 DIAGNOSIS — B351 Tinea unguium: Secondary | ICD-10-CM

## 2022-07-25 DIAGNOSIS — E1142 Type 2 diabetes mellitus with diabetic polyneuropathy: Secondary | ICD-10-CM | POA: Diagnosis not present

## 2022-07-25 DIAGNOSIS — L84 Corns and callosities: Secondary | ICD-10-CM | POA: Diagnosis not present

## 2022-07-25 DIAGNOSIS — M79675 Pain in left toe(s): Secondary | ICD-10-CM | POA: Diagnosis not present

## 2022-07-25 NOTE — Progress Notes (Signed)
This patient returns to my office for at risk foot care.  This patient requires this care by a professional since this patient will be at risk due to having diabetes. Patient is interested in the status of her diabetic shoes.  This patient is unable to cut nails herself since the patient cannot reach her nails.These nails are painful walking and wearing shoes.  This patient presents for at risk foot care today.  General Appearance  Alert, conversant and in no acute stress.  Vascular  Dorsalis pedis and posterior tibial  pulses are weakly  palpable  bilaterally.  Capillary return is within normal limits  bilaterally. Temperature is within normal limits  Bilaterally. Absent digital hair.  Neurologic  Senn-Weinstein monofilament wire test diminished   bilaterally. Muscle power within normal limits bilaterally.  Nails Thick disfigured discolored nails with subungual debris  from hallux to fifth toes bilaterally. No evidence of bacterial infection or drainage bilaterally.  Orthopedic  No limitations of motion  feet .  No crepitus or effusions noted.  No bony pathology or digital deformities noted. HAV  B/L.  Hammer toe second right.  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  Skin  normotropic skin with no porokeratosis noted bilaterally.  No signs of infections or ulcers noted.   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 Porokeratosis sub 2 asymptomaticleft foot.  Onychomycosis  Pain in right toes  Pain in left toes  HD 5th toe  B/L.   Consent was obtained for treatment procedures.   Mechanical debridement of nails 1-5  bilaterally performed with a nail nipper.  Filed with dremel without incident. Debride  HD  B/L.   Return office visit   3 months                   Told patient to return for periodic foot care and evaluation due to potential at risk complications.   Gardiner Barefoot DPM

## 2022-10-24 ENCOUNTER — Other Ambulatory Visit: Payer: Medicare Other

## 2022-10-24 ENCOUNTER — Ambulatory Visit (INDEPENDENT_AMBULATORY_CARE_PROVIDER_SITE_OTHER): Payer: 59 | Admitting: Podiatry

## 2022-10-24 ENCOUNTER — Ambulatory Visit (INDEPENDENT_AMBULATORY_CARE_PROVIDER_SITE_OTHER): Payer: 59

## 2022-10-24 ENCOUNTER — Encounter: Payer: Self-pay | Admitting: Podiatry

## 2022-10-24 DIAGNOSIS — M2012 Hallux valgus (acquired), left foot: Secondary | ICD-10-CM

## 2022-10-24 DIAGNOSIS — M79675 Pain in left toe(s): Secondary | ICD-10-CM

## 2022-10-24 DIAGNOSIS — M79674 Pain in right toe(s): Secondary | ICD-10-CM

## 2022-10-24 DIAGNOSIS — M2010 Hallux valgus (acquired), unspecified foot: Secondary | ICD-10-CM

## 2022-10-24 DIAGNOSIS — L84 Corns and callosities: Secondary | ICD-10-CM | POA: Diagnosis not present

## 2022-10-24 DIAGNOSIS — E1142 Type 2 diabetes mellitus with diabetic polyneuropathy: Secondary | ICD-10-CM

## 2022-10-24 DIAGNOSIS — B351 Tinea unguium: Secondary | ICD-10-CM

## 2022-10-24 DIAGNOSIS — M2041 Other hammer toe(s) (acquired), right foot: Secondary | ICD-10-CM

## 2022-10-24 DIAGNOSIS — M2011 Hallux valgus (acquired), right foot: Secondary | ICD-10-CM

## 2022-10-24 NOTE — Progress Notes (Signed)
This patient returns to my office for at risk foot care.  This patient requires this care by a professional since this patient will be at risk due to having diabetes. Patient is interested in the status of her diabetic shoes.  This patient is unable to cut nails herself since the patient cannot reach her nails.These nails are painful walking and wearing shoes.  This patient presents for at risk foot care today.  General Appearance  Alert, conversant and in no acute stress.  Vascular  Dorsalis pedis and posterior tibial  pulses are weakly  palpable  bilaterally.  Capillary return is within normal limits  bilaterally. Temperature is within normal limits  Bilaterally. Absent digital hair.  Neurologic  Senn-Weinstein monofilament wire test diminished   bilaterally. Muscle power within normal limits bilaterally.  Nails Thick disfigured discolored nails with subungual debris  from hallux to fifth toes bilaterally. No evidence of bacterial infection or drainage bilaterally.  Orthopedic  No limitations of motion  feet .  No crepitus or effusions noted.  No bony pathology or digital deformities noted. HAV  B/L.  Hammer toe second right.  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  Skin  normotropic skin with no porokeratosis noted bilaterally.  No signs of infections or ulcers noted.   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 Porokeratosis sub 2 asymptomaticleft foot.  Onychomycosis  Pain in right toes  Pain in left toes  HD 5th toe  B/L.   Consent was obtained for treatment procedures.   Mechanical debridement of nails 1-5  bilaterally performed with a nail nipper.  Filed with dremel without incident. Debride  HD  B/L.  Patient has an appointment with pedorthist.   Return office visit   3 months                   Told patient to return for periodic foot care and evaluation due to potential at risk complications.   Gardiner Barefoot DPM

## 2022-10-24 NOTE — Progress Notes (Signed)
Patient presents to the office today for diabetic shoe and insole measuring.  Patient was measured with brannock device to determine size and width for 1 pair of extra depth shoes and foam casted for 3 pair of insoles.   ABN signed.   Documentation of medical necessity will be sent to patient's treating diabetic doctor to verify and sign.   Patient's diabetic provider: Glendon Axe, MD   Shoes and insoles will be ordered at that time and patient will be notified for an appointment for fitting when they arrive.   Patient shoe selection-   1st   Shoe choice:   A600W  Shoe size ordered: 9.5 XW

## 2022-11-09 NOTE — Addendum Note (Signed)
Addended by: Helane Gunther on: 11/09/2022 02:27 PM   Modules accepted: Orders

## 2023-01-23 ENCOUNTER — Ambulatory Visit (INDEPENDENT_AMBULATORY_CARE_PROVIDER_SITE_OTHER): Payer: 59 | Admitting: Podiatry

## 2023-01-23 ENCOUNTER — Encounter: Payer: Self-pay | Admitting: Podiatry

## 2023-01-23 VITALS — BP 150/72

## 2023-01-23 DIAGNOSIS — Q828 Other specified congenital malformations of skin: Secondary | ICD-10-CM | POA: Diagnosis not present

## 2023-01-23 DIAGNOSIS — M79674 Pain in right toe(s): Secondary | ICD-10-CM | POA: Diagnosis not present

## 2023-01-23 DIAGNOSIS — B351 Tinea unguium: Secondary | ICD-10-CM | POA: Diagnosis not present

## 2023-01-23 DIAGNOSIS — M2010 Hallux valgus (acquired), unspecified foot: Secondary | ICD-10-CM

## 2023-01-23 DIAGNOSIS — E1142 Type 2 diabetes mellitus with diabetic polyneuropathy: Secondary | ICD-10-CM | POA: Diagnosis not present

## 2023-01-23 DIAGNOSIS — M79675 Pain in left toe(s): Secondary | ICD-10-CM | POA: Diagnosis not present

## 2023-01-23 NOTE — Progress Notes (Addendum)
Patient recasted for diabetic shoes, order placed and paperwork given to patient.    Diabetes    RTC pick up shoes.  Helane Gunther DPM

## 2023-01-23 NOTE — Progress Notes (Signed)
This patient returns to my office for at risk foot care.  This patient requires this care by a professional since this patient will be at risk due to having diabetes. Patient is interested in the status of her diabetic shoes.  This patient is unable to cut nails herself since the patient cannot reach her nails.These nails are painful walking and wearing shoes.  This patient presents for at risk foot care today.  General Appearance  Alert, conversant and in no acute stress.  Vascular  Dorsalis pedis and posterior tibial  pulses are weakly  palpable  bilaterally.  Capillary return is within normal limits  bilaterally. Temperature is within normal limits  Bilaterally. Absent digital hair.  Neurologic  Senn-Weinstein monofilament wire test diminished   bilaterally. Muscle power within normal limits bilaterally.  Nails Thick disfigured discolored nails with subungual debris  from hallux to fifth toes bilaterally. No evidence of bacterial infection or drainage bilaterally.  Orthopedic  No limitations of motion  feet .  No crepitus or effusions noted.  No bony pathology or digital deformities noted. HAV  B/L.  Hammer toe second right.  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  Skin  normotropic skin with no porokeratosis noted bilaterally.  No signs of infections or ulcers noted.   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 Porokeratosis sub 2 asymptomaticleft foot.  Onychomycosis  Pain in right toes  Pain in left toes  HD 5th toe  B/L.   Consent was obtained for treatment procedures.   Mechanical debridement of nails 1-5  bilaterally performed with a nail nipper.  Filed with dremel without incident. Debride  HD  B/L.  Patient has an appointment with pedorthist.   Return office visit   3 months                   Told patient to return for periodic foot care and evaluation due to potential at risk complications.   Nikalas Bramel DPM  

## 2023-04-25 ENCOUNTER — Ambulatory Visit: Payer: 59 | Admitting: Podiatry

## 2023-05-13 ENCOUNTER — Ambulatory Visit (INDEPENDENT_AMBULATORY_CARE_PROVIDER_SITE_OTHER): Payer: 59 | Admitting: Podiatry

## 2023-05-13 ENCOUNTER — Encounter: Payer: Self-pay | Admitting: Podiatry

## 2023-05-13 DIAGNOSIS — Q828 Other specified congenital malformations of skin: Secondary | ICD-10-CM

## 2023-05-13 DIAGNOSIS — E1142 Type 2 diabetes mellitus with diabetic polyneuropathy: Secondary | ICD-10-CM | POA: Diagnosis not present

## 2023-05-13 DIAGNOSIS — B351 Tinea unguium: Secondary | ICD-10-CM

## 2023-05-13 DIAGNOSIS — M79674 Pain in right toe(s): Secondary | ICD-10-CM | POA: Diagnosis not present

## 2023-05-13 DIAGNOSIS — M79675 Pain in left toe(s): Secondary | ICD-10-CM

## 2023-05-13 NOTE — Progress Notes (Signed)
This patient returns to my office for at risk foot care.  This patient requires this care by a professional since this patient will be at risk due to having diabetes. Patient is interested in the status of her diabetic shoes.  This patient is unable to cut nails herself since the patient cannot reach her nails.These nails are painful walking and wearing shoes.  This patient presents for at risk foot care today.  General Appearance  Alert, conversant and in no acute stress.  Vascular  Dorsalis pedis and posterior tibial  pulses are weakly  palpable  bilaterally.  Capillary return is within normal limits  bilaterally. Temperature is within normal limits  Bilaterally. Absent digital hair.  Neurologic  Senn-Weinstein monofilament wire test diminished   bilaterally. Muscle power within normal limits bilaterally.  Nails Thick disfigured discolored nails with subungual debris  from hallux to fifth toes bilaterally. No evidence of bacterial infection or drainage bilaterally.  Orthopedic  No limitations of motion  feet .  No crepitus or effusions noted.  No bony pathology or digital deformities noted. HAV  B/L.  Hammer toe second right.  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  Skin  normotropic skin with no porokeratosis noted bilaterally.  No signs of infections or ulcers noted.   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 Porokeratosis sub 2 asymptomaticleft foot.  Onychomycosis  Pain in right toes  Pain in left toes  Porokeratosis sub 3 left foot.  Consent was obtained for treatment procedures.   Mechanical debridement of nails 1-5  bilaterally performed with a nail nipper.  Filed with dremel without incident. Debride  porokeratosis left forefoot.   Return office visit   3 months                   Told patient to return for periodic foot care and evaluation due to potential at risk complications.   Helane Gunther DPM

## 2023-08-15 ENCOUNTER — Ambulatory Visit: Payer: 59 | Admitting: Podiatry

## 2024-02-24 ENCOUNTER — Ambulatory Visit (INDEPENDENT_AMBULATORY_CARE_PROVIDER_SITE_OTHER): Admitting: Podiatry

## 2024-02-24 ENCOUNTER — Encounter: Payer: Self-pay | Admitting: Podiatry

## 2024-02-24 VITALS — Ht 66.0 in | Wt 217.0 lb

## 2024-02-24 DIAGNOSIS — L237 Allergic contact dermatitis due to plants, except food: Secondary | ICD-10-CM

## 2024-02-24 NOTE — Progress Notes (Signed)
   Chief Complaint  Patient presents with   Wound Check    Pt is here due to bilateral foot and ankle swelling and wounds to the back of ankles, she states the wounds began 2 weeks ago, she states she was in the yard doing yard work with long pants but no socks, no sure if she got into poison ivy but has a rash like wound to both ankles she states they itch and has redness to them both, was seen by her PCP and put on antibiotics and given cream to put on it and referred to see vein and vascular.    HPI: 73 y.o. female PMHx T2DM presenting today for onset of blistering itching and skin lesions to the bilateral ankles and upper right arm.  She does recall that about 3-4 weeks ago she was at her sister's home doing yard work pulling down and cutting poison ivy.  She was not wearing socks at the time.  She was recently seen at [facility] and prescribed oral antibiotics which she is still currently taking as well as topical antibiotic ointment.  Presenting here for follow-up  Past Medical History:  Diagnosis Date   Breast cancer (HCC)    Diabetes mellitus    Hayfever    Hypertension    Ovarian cancer (HCC)    Type II or unspecified type diabetes mellitus without mention of complication, not stated as uncontrolled 10/31/2013    Past Surgical History:  Procedure Laterality Date   ABDOMINAL HYSTERECTOMY     BILATERAL TOTAL MASTECTOMY WITH AXILLARY LYMPH NODE DISSECTION     left ankle surgery     TONSILLECTOMY      Allergies  Allergen Reactions   Other Other (See Comments)   Sulfa Antibiotics Rash     Physical Exam: General: The patient is alert and oriented x3 in no acute distress.  Dermatology: Skin is warm, dry and supple bilateral lower extremities.  Blistering lesions with darkening discoloration of the skin around the ankles noted bilateral.  This is an acute onset and patient only reports that this has been going on for about 3-4 weeks.  Pruritus also noted.  Findings are  consistent with poison ivy  Vascular: Edema noted bilateral lower extremities.  Pulses palpable  Neurological: Grossly intact via light touch  Musculoskeletal Exam: No pedal deformities noted   Assessment/Plan of Care: 1.  Poison ivy bilateral lower extremities and ankles  -Patient evaluated.  Clinically this does appear to be equivalent with poison ivy reaction -Continue oral antibiotics prescribed at [facility] clinic x 10 days until completed.  No refills -Continue topical antibiotic ointment that was also prescribed. -Recommend incorporating pink calamine applied to the ankles daily -Okay to wash the foot and ankles in warm water and warm antibacterial soap -If condition worsens recommend that she goes immediately to the emergency department -Return to clinic 2 weeks       Thresa EMERSON Sar, DPM [facility]  Dr. Thresa EMERSON Sar, DPM    [address] [place], [place] [address]                Office [phone]  Fax [phone]

## 2024-03-11 ENCOUNTER — Ambulatory Visit (INDEPENDENT_AMBULATORY_CARE_PROVIDER_SITE_OTHER): Admitting: Podiatry

## 2024-03-11 ENCOUNTER — Encounter: Payer: Self-pay | Admitting: Podiatry

## 2024-03-11 VITALS — Ht 66.0 in | Wt 217.0 lb

## 2024-03-11 DIAGNOSIS — L237 Allergic contact dermatitis due to plants, except food: Secondary | ICD-10-CM

## 2024-03-11 MED ORDER — HYDROXYZINE PAMOATE 25 MG PO CAPS: 25.0000 mg | ORAL_CAPSULE | Freq: Three times a day (TID) | ORAL | 0 refills | Status: AC | PRN

## 2024-03-11 NOTE — Progress Notes (Signed)
   Chief Complaint  Patient presents with   Poison Ivy    Pt is here to f/u on bilateral foot and ankle due to dermatitis, she states its still the same.    HPI: 73 y.o. female PMHx T2DM presenting today for follow-up evaluation of poison ivy dermatitis which began about 1 month ago.  She says that her legs are doing much better but now she is having itching and rash around her neck.  She believes the poison ivy was transferred from her hands to her neck.  She has been using over-the-counter soaps and oils which have helped around the legs but she has not applied that yet to the neck.  She continues to have significant itching around her neck.  The legs are no longer itching  Past Medical History:  Diagnosis Date   Breast cancer (HCC)    Diabetes mellitus    Hayfever    Hypertension    Ovarian cancer (HCC)    Type II or unspecified type diabetes mellitus without mention of complication, not stated as uncontrolled 10/31/2013    Past Surgical History:  Procedure Laterality Date   ABDOMINAL HYSTERECTOMY     BILATERAL TOTAL MASTECTOMY WITH AXILLARY LYMPH NODE DISSECTION     left ankle surgery     TONSILLECTOMY      Allergies  Allergen Reactions   Other Other (See Comments)   Sulfa Antibiotics Rash     Physical Exam: General: The patient is alert and oriented x3 in no acute distress.  Dermatology: Skin is warm, dry and supple bilateral lower extremities.  Lesions around the bilateral lower extremities are significantly improved.  No itching noted.  She does have pruritus around her neck with dermatitis lesions  Vascular: Chronic edema noted bilateral lower extremities.  Pulses palpable  Neurological: Grossly intact via light touch  Musculoskeletal Exam: No pedal deformities noted   Assessment/Plan of Care: 1.  Poison ivy bilateral lower extremities and ankles  -Patient evaluated.   -Prescription for Atarax  25 mg Q8H -Continue over-the-counter soaps and oils specifically  indicated for poison ivy -Return to clinic with me PRN.  If she continues to have itching around her neck recommend follow-up with PCP      Thresa EMERSON Sar, DPM [facility]  Dr. Thresa EMERSON Sar, DPM    [address] [place], [place] [address]                Office [phone]  Fax [phone]

## 2024-03-16 ENCOUNTER — Ambulatory Visit (INDEPENDENT_AMBULATORY_CARE_PROVIDER_SITE_OTHER): Admitting: Podiatry

## 2024-03-16 ENCOUNTER — Encounter: Payer: Self-pay | Admitting: Podiatry

## 2024-03-16 DIAGNOSIS — M79675 Pain in left toe(s): Secondary | ICD-10-CM | POA: Diagnosis not present

## 2024-03-16 DIAGNOSIS — M79674 Pain in right toe(s): Secondary | ICD-10-CM | POA: Diagnosis not present

## 2024-03-16 DIAGNOSIS — E1142 Type 2 diabetes mellitus with diabetic polyneuropathy: Secondary | ICD-10-CM

## 2024-03-16 DIAGNOSIS — B351 Tinea unguium: Secondary | ICD-10-CM

## 2024-03-16 NOTE — Progress Notes (Signed)
 This patient returns to my office for at risk foot care.  This patient requires this care by a professional since this patient will be at risk due to having diabetes. Patient is interested in the status of her diabetic shoes.  This patient is unable to cut nails herself since the patient cannot reach her nails.These nails are painful walking and wearing shoes.  This patient presents for at risk foot care today.  General Appearance  Alert, conversant and in no acute stress.  Vascular  Dorsalis pedis and posterior tibial  pulses are weakly  palpable  bilaterally.  Capillary return is within normal limits  bilaterally. Temperature is within normal limits  Bilaterally. Absent digital hair.  Neurologic  Senn-Weinstein monofilament wire test diminished   bilaterally. Muscle power within normal limits bilaterally.  Nails Thick disfigured discolored nails with subungual debris  from hallux to fifth toes bilaterally. No evidence of bacterial infection or drainage bilaterally.  Orthopedic  No limitations of motion  feet .  No crepitus or effusions noted.  No bony pathology or digital deformities noted. HAV  B/L.  Hammer toe second right.  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  Skin  normotropic skin with no porokeratosis noted bilaterally.  No signs of infections or ulcers noted.    asymptomaticleft foot.  Onychomycosis  Pain in right toes  Pain in left toes    Consent was obtained for treatment procedures.   Mechanical debridement of nails 1-5  bilaterally performed with a nail nipper.  Filed with dremel without incident. Debride  porokeratosis left forefoot.   Return office visit   3 months                   Told patient to return for periodic foot care and evaluation due to potential at risk complications.   Cordella Bold DPM

## 2024-06-15 ENCOUNTER — Ambulatory Visit (INDEPENDENT_AMBULATORY_CARE_PROVIDER_SITE_OTHER): Admitting: Podiatry

## 2024-06-15 ENCOUNTER — Encounter: Payer: Self-pay | Admitting: Podiatry

## 2024-06-15 DIAGNOSIS — M79674 Pain in right toe(s): Secondary | ICD-10-CM | POA: Diagnosis not present

## 2024-06-15 DIAGNOSIS — M79675 Pain in left toe(s): Secondary | ICD-10-CM

## 2024-06-15 DIAGNOSIS — L84 Corns and callosities: Secondary | ICD-10-CM

## 2024-06-15 DIAGNOSIS — E1142 Type 2 diabetes mellitus with diabetic polyneuropathy: Secondary | ICD-10-CM

## 2024-06-15 DIAGNOSIS — B351 Tinea unguium: Secondary | ICD-10-CM | POA: Diagnosis not present

## 2024-06-15 NOTE — Progress Notes (Signed)
 This patient returns to my office for at risk foot care.  This patient requires this care by a professional since this patient will be at risk due to having diabetes. Patient is interested in the status of her diabetic shoes.  This patient is unable to cut nails herself since the patient cannot reach her nails.These nails are painful walking and wearing shoes.  This patient presents for at risk foot care today.  General Appearance  Alert, conversant and in no acute stress.  Vascular  Dorsalis pedis and posterior tibial  pulses are weakly  palpable  bilaterally.  Capillary return is within normal limits  bilaterally. Temperature is within normal limits  Bilaterally. Absent digital hair.  Neurologic  Senn-Weinstein monofilament wire test diminished   bilaterally. Muscle power within normal limits bilaterally.  Nails Thick disfigured discolored nails with subungual debris  from hallux to fifth toes bilaterally. No evidence of bacterial infection or drainage bilaterally.  Orthopedic  No limitations of motion  feet .  No crepitus or effusions noted.  No bony pathology or digital deformities noted. HAV  B/L.  Hammer toe second right.  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  Skin  normotropic skin with no porokeratosis noted bilaterally.  No signs of infections or ulcers noted.    Porokeratosis sub 2/3 left foot.  Onychomycosis  Pain in right toes  Pain in left toes  Porokeratosis left foot.  Consent was obtained for treatment procedures.   Mechanical debridement of nails 1-5  bilaterally performed with a nail nipper.  Filed with dremel without incident. Debride  porokeratosis left forefoot with # 15 blade.   Return office visit   3 months                   Told patient to return for periodic foot care and evaluation due to potential at risk complications.   Cordella Bold DPM

## 2024-09-14 ENCOUNTER — Ambulatory Visit: Admitting: Podiatry
